# Patient Record
Sex: Male | Born: 1990 | Race: White | Hispanic: No | Marital: Single | Smoking: Never smoker
Health system: Southern US, Community
[De-identification: ages and names within clinical notes are randomized; demographics above are authoritative.]

---
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
## 2008-02-14 ENCOUNTER — Ambulatory Visit: Payer: Self-pay | Admitting: *Deleted

## 2008-05-08 ENCOUNTER — Ambulatory Visit: Payer: Self-pay | Admitting: *Deleted

## 2008-08-24 ENCOUNTER — Ambulatory Visit: Payer: Self-pay | Admitting: Pediatrics

## 2008-12-11 ENCOUNTER — Ambulatory Visit: Payer: Self-pay | Admitting: Pediatrics

## 2009-03-14 ENCOUNTER — Ambulatory Visit: Payer: Self-pay | Admitting: Pediatrics

## 2009-07-09 ENCOUNTER — Ambulatory Visit: Payer: Self-pay | Admitting: Pediatrics

## 2011-02-12 LAB — D-DIMER, QUANTITATIVE: D-Dimer, Quant: 0.36

## 2016-05-23 ENCOUNTER — Emergency Department (HOSPITAL_COMMUNITY): Payer: Worker's Compensation

## 2016-05-23 ENCOUNTER — Encounter (HOSPITAL_COMMUNITY): Payer: Self-pay | Admitting: Nurse Practitioner

## 2016-05-23 ENCOUNTER — Inpatient Hospital Stay (HOSPITAL_COMMUNITY)
Admission: EM | Admit: 2016-05-23 | Discharge: 2016-05-26 | DRG: 493 | Disposition: A | Discharge status: Home / Self Care | Payer: Worker's Compensation | Attending: Orthopaedic Surgery | Admitting: Orthopaedic Surgery

## 2016-05-23 DIAGNOSIS — S82241A Displaced spiral fracture of shaft of right tibia, initial encounter for closed fracture: Principal | ICD-10-CM | POA: Diagnosis present

## 2016-05-23 DIAGNOSIS — S82441A Displaced spiral fracture of shaft of right fibula, initial encounter for closed fracture: Secondary | ICD-10-CM | POA: Diagnosis present

## 2016-05-23 DIAGNOSIS — S82831A Other fracture of upper and lower end of right fibula, initial encounter for closed fracture: Secondary | ICD-10-CM

## 2016-05-23 DIAGNOSIS — M79604 Pain in right leg: Secondary | ICD-10-CM | POA: Diagnosis not present

## 2016-05-23 DIAGNOSIS — D62 Acute posthemorrhagic anemia: Secondary | ICD-10-CM | POA: Diagnosis not present

## 2016-05-23 DIAGNOSIS — W000XXA Fall on same level due to ice and snow, initial encounter: Secondary | ICD-10-CM | POA: Diagnosis present

## 2016-05-23 DIAGNOSIS — Z9889 Other specified postprocedural states: Secondary | ICD-10-CM

## 2016-05-23 DIAGNOSIS — T148XXA Other injury of unspecified body region, initial encounter: Secondary | ICD-10-CM

## 2016-05-23 DIAGNOSIS — W19XXXA Unspecified fall, initial encounter: Secondary | ICD-10-CM

## 2016-05-23 DIAGNOSIS — S82391A Other fracture of lower end of right tibia, initial encounter for closed fracture: Secondary | ICD-10-CM

## 2016-05-23 DIAGNOSIS — S82241D Displaced spiral fracture of shaft of right tibia, subsequent encounter for closed fracture with routine healing: Secondary | ICD-10-CM | POA: Diagnosis present

## 2016-05-23 MED ORDER — FENTANYL CITRATE (PF) 100 MCG/2ML IJ SOLN
100.0000 ug | Freq: Once | INTRAMUSCULAR | Status: AC
  Administered 2016-05-23: 100 ug via INTRAVENOUS
  Filled 2016-05-23: qty 2

## 2016-05-23 NOTE — ED Triage Notes (Signed)
Pt was brought in by EMS. Per EMS patient slipped and fell on black ice at work. When he stood up he could not apply weight or pressure and noticed it was deformed. Also stated it hurt really bad. Per EMS deformity noted and was splinted with sheet and pillow for comfort and pressure relief. Positive strong pulses, patient denied hitting head or LOC per EMS. He was given 100mcg via #18 LAC. VSS 16/62, 100, 14, 98%RA.

## 2016-05-23 NOTE — ED Provider Notes (Signed)
[facility]-EMERGENCY DEPT Provider Note   CSN: [id number] Arrival date & time: 05/23/16  2227     History   Chief Complaint Chief Complaint  Patient presents with  . Leg Injury  . Fall    HPI Jeremy Spencer is a 26 y.o. male.  HPI 26 year old male who presents after mechanical fall. He is otherwise healthy. Taking a break from work tonight when he woke slipped on black ice while walking to his car. He did not have head injury or loss of consciousness. States that he had pain to the right lower leg, and was unable to get up and ambulate. Denies any nausea or vomiting, confusion, chest pain, back pain, abdominal pain or difficulty breathing. Denies any weakness or numbness to the right lower extremity. History reviewed. No pertinent past medical history.  Patient Active Problem List   Diagnosis Date Noted  . Displaced spiral fracture of shaft of right tibia, initial encounter for closed fracture 05/24/2016    History reviewed. No pertinent surgical history.     Home Medications    Prior to Admission medications   Medication Sig Start Date End Date Taking? Authorizing Provider  oxyCODONE-acetaminophen (PERCOCET/ROXICET) 5-325 MG tablet Take 1-2 tablets by mouth every 6 (six) hours as needed for moderate pain or severe pain. 05/24/16   Lavera Guiseana Duo Kyrian Stage, MD    Family History History reviewed. No pertinent family history.  Social History Social History  Substance Use Topics  . Smoking status: Never Smoker  . Smokeless tobacco: Never Used  . Alcohol use No     Allergies   Amoxicillin   Review of Systems Review of Systems 10/14 systems reviewed and are negative other than those stated in the HPI   Physical Exam Updated Vital Signs BP (!) 155/84 (BP Location: Right Arm)   Pulse (!) 102   Temp 98.6 F (37 C) (Oral)   Resp 18   Ht 6\' 3"  (1.905 m)   Wt (!) 320 lb (145.1 kg)   SpO2 100%   BMI 40.00 kg/m   Physical Exam Physical Exam  Nursing note and vitals  reviewed. Constitutional: Well developed, well nourished, non-toxic, and in no acute distress Head: Normocephalic and atraumatic.  Mouth/Throat: Oropharynx is clear and moist.  Neck: Normal range of motion. Neck supple.  no cervical spine tenderness Cardiovascular: Normal rate and regular rhythm.   +2 DP pulses bilaterally Pulmonary/Chest: Effort normal and breath sounds normal.  no chest wall tenderness Abdominal: Soft. There is no tenderness. There is no rebound and no guarding.  Musculoskeletal: There is closed deformity to the distal right lower leg.  Neurological: Alert, no facial droop, fluent speech, sensation to light touch in bilateral lower extremities, full strength in large toe dorsi and plantar flexion in the right lower extremity. Skin: Skin is warm and dry.  Psychiatric: Cooperative   ED Treatments / Results  Labs (all labs ordered are listed, but only abnormal results are displayed) Labs Reviewed  SURGICAL PCR SCREEN    EKG  EKG Interpretation None       Radiology Dg Tibia/fibula Right  Result Date: 05/23/2016 CLINICAL DATA:  Fall on ice. Right leg pain and deformity. Initial encounter. EXAM: RIGHT TIBIA AND FIBULA - 2 VIEW COMPARISON:  None. FINDINGS: Comminuted fractures of the distal tibial and fibular diaphyses are seen, with mild lateral displacement and posterior angulation of the distal fracture fragments. IMPRESSION: Comminuted fractures of distal tibial and fibular diaphyses, as described above. Electronically Signed   By: Jonny RuizJohn  Eppie Gibson M.D.   On: 05/23/2016 23:33   Dg Ankle Complete Right  Result Date: 05/23/2016 CLINICAL DATA:  Larey Seat on ice at work today. Right ankle pain and deformity. Initial encounter. EXAM: RIGHT ANKLE - COMPLETE 3+ VIEW COMPARISON:  None. FINDINGS: Comminuted fractures of the distal tibial and fibular diaphyses. There is lateral displacement and mild posterior angulation of the distal fracture fragments. No other fractures are seen  involving the ankle joint. No evidence of ankle dislocation. IMPRESSION: Comminuted fractures of distal tibial and fibular diaphyses. Electronically Signed   By: Myles Rosenthal M.D.   On: 05/23/2016 23:34    Procedures Procedures (including critical care time)  Medications Ordered in ED Medications  acetaminophen (TYLENOL) tablet 650 mg (650 mg Oral Given 05/24/16 1130)    Or  acetaminophen (TYLENOL) suppository 650 mg ( Rectal See Alternative 05/24/16 1130)  oxyCODONE (Oxy IR/ROXICODONE) immediate release tablet 5-10 mg (not administered)  morphine 2 MG/ML injection 1 mg (1 mg Intravenous Given 05/24/16 0703)  methocarbamol (ROBAXIN) tablet 500 mg (500 mg Oral Given 05/24/16 0704)    Or  methocarbamol (ROBAXIN) 500 mg in dextrose 5 % 50 mL IVPB ( Intravenous See Alternative 05/24/16 0704)  fentaNYL (SUBLIMAZE) injection 100 mcg (100 mcg Intravenous Given 05/23/16 2351)  HYDROmorphone (DILAUDID) injection 1 mg (1 mg Intravenous Given 05/24/16 0101)  sodium chloride 0.9 % bolus 1,000 mL (0 mLs Intravenous Stopped 05/24/16 0215)  HYDROmorphone (DILAUDID) injection 1 mg (1 mg Intravenous Given 05/24/16 0235)     Initial Impression / Assessment and Plan / ED Course  I have reviewed the triage vital signs and the nursing notes.  Pertinent labs & imaging results that were available during my care of the patient were reviewed by me and considered in my medical decision making (see chart for details).     Presenting after mechanical fall with right lower leg pain and deformity. Extremity is neurovascularly in tact. No other injuries noted on exam or by history. XR of tib/fib/ankle visualized. There is comminuted, displaced closed fractures involving distal tibia and fibula. Will consult orthopedic surgery regarding management.  Final Clinical Impressions(s) / ED Diagnoses   Final diagnoses:  Fracture  Other closed fracture of distal end of right tibia, initial encounter  Other closed fracture of  distal end of right fibula, initial encounter    New Prescriptions Current Discharge Medication List    START taking these medications   Details  oxyCODONE-acetaminophen (PERCOCET/ROXICET) 5-325 MG tablet Take 1-2 tablets by mouth every 6 (six) hours as needed for moderate pain or severe pain. Qty: 20 tablet, Refills: 0         Lavera Guise, MD 05/24/16 1135

## 2016-05-23 NOTE — ED Notes (Signed)
Bed: [id number] Expected date:  Expected time:  Means of arrival:  Comments: 26 yo M  Fall, leg injury with deformity

## 2016-05-24 ENCOUNTER — Inpatient Hospital Stay (HOSPITAL_COMMUNITY): Payer: Worker's Compensation | Admitting: Anesthesiology

## 2016-05-24 ENCOUNTER — Encounter (HOSPITAL_COMMUNITY)
Admission: EM | Disposition: A | Discharge status: Home / Self Care | Payer: Self-pay | Source: Home / Self Care | Attending: Orthopaedic Surgery

## 2016-05-24 ENCOUNTER — Inpatient Hospital Stay (HOSPITAL_COMMUNITY): Payer: Worker's Compensation

## 2016-05-24 ENCOUNTER — Encounter (HOSPITAL_COMMUNITY): Payer: Self-pay | Admitting: Critical Care Medicine

## 2016-05-24 DIAGNOSIS — S82241A Displaced spiral fracture of shaft of right tibia, initial encounter for closed fracture: Principal | ICD-10-CM

## 2016-05-24 DIAGNOSIS — S82441A Displaced spiral fracture of shaft of right fibula, initial encounter for closed fracture: Secondary | ICD-10-CM | POA: Diagnosis present

## 2016-05-24 DIAGNOSIS — M79604 Pain in right leg: Secondary | ICD-10-CM | POA: Diagnosis present

## 2016-05-24 DIAGNOSIS — S82241D Displaced spiral fracture of shaft of right tibia, subsequent encounter for closed fracture with routine healing: Secondary | ICD-10-CM | POA: Diagnosis present

## 2016-05-24 DIAGNOSIS — D62 Acute posthemorrhagic anemia: Secondary | ICD-10-CM | POA: Diagnosis not present

## 2016-05-24 DIAGNOSIS — W000XXA Fall on same level due to ice and snow, initial encounter: Secondary | ICD-10-CM | POA: Diagnosis present

## 2016-05-24 HISTORY — PX: TIBIA IM NAIL INSERTION: SHX2516

## 2016-05-24 LAB — SURGICAL PCR SCREEN
MRSA, PCR: NEGATIVE
STAPHYLOCOCCUS AUREUS: NEGATIVE

## 2016-05-24 SURGERY — INSERTION, INTRAMEDULLARY ROD, TIBIA
Anesthesia: General | Site: Leg Upper | Laterality: Right

## 2016-05-24 MED ORDER — SUCCINYLCHOLINE CHLORIDE 200 MG/10ML IV SOSY
PREFILLED_SYRINGE | INTRAVENOUS | Status: AC
  Filled 2016-05-24: qty 20

## 2016-05-24 MED ORDER — METHOCARBAMOL 500 MG PO TABS: 500.0000 mg | ORAL_TABLET | Freq: Four times a day (QID) | ORAL | Status: DC | PRN

## 2016-05-24 MED ORDER — CEFAZOLIN SODIUM 1 G IJ SOLR
INTRAMUSCULAR | Status: AC
  Filled 2016-05-24: qty 30

## 2016-05-24 MED ORDER — CEFAZOLIN SODIUM 1 G IJ SOLR
INTRAMUSCULAR | Status: DC | PRN
  Administered 2016-05-24: 3 g via INTRAMUSCULAR

## 2016-05-24 MED ORDER — SODIUM CHLORIDE 0.9 % IV BOLUS (SEPSIS)
1000.0000 mL | Freq: Once | INTRAVENOUS | Status: AC
  Administered 2016-05-24: 1000 mL via INTRAVENOUS

## 2016-05-24 MED ORDER — OXYCODONE HCL 5 MG PO TABS: 5.0000 mg | ORAL_TABLET | ORAL | 0 refills | Status: AC | PRN

## 2016-05-24 MED ORDER — METHOCARBAMOL 1000 MG/10ML IJ SOLN
500.0000 mg | Freq: Four times a day (QID) | INTRAMUSCULAR | Status: DC | PRN
  Filled 2016-05-24: qty 5

## 2016-05-24 MED ORDER — HYDROMORPHONE HCL 1 MG/ML IJ SOLN
0.2500 mg | INTRAMUSCULAR | Status: DC | PRN
  Administered 2016-05-24 (×2): 0.5 mg via INTRAVENOUS

## 2016-05-24 MED ORDER — ONDANSETRON HCL 4 MG PO TABS: 4.0000 mg | ORAL_TABLET | Freq: Four times a day (QID) | ORAL | Status: DC | PRN

## 2016-05-24 MED ORDER — MEPERIDINE HCL 25 MG/ML IJ SOLN: 6.2500 mg | INTRAMUSCULAR | Status: DC | PRN

## 2016-05-24 MED ORDER — METHOCARBAMOL 1000 MG/10ML IJ SOLN
500.0000 mg | Freq: Four times a day (QID) | INTRAVENOUS | Status: DC | PRN
  Filled 2016-05-24: qty 5

## 2016-05-24 MED ORDER — OXYCODONE HCL ER 10 MG PO T12A
10.0000 mg | EXTENDED_RELEASE_TABLET | Freq: Two times a day (BID) | ORAL | Status: DC
  Administered 2016-05-24 – 2016-05-25 (×3): 10 mg via ORAL
  Filled 2016-05-24 (×3): qty 1

## 2016-05-24 MED ORDER — HYDROMORPHONE HCL 1 MG/ML IJ SOLN
1.0000 mg | Freq: Once | INTRAMUSCULAR | Status: AC
  Administered 2016-05-24: 1 mg via INTRAVENOUS
  Filled 2016-05-24: qty 1

## 2016-05-24 MED ORDER — ONDANSETRON HCL 4 MG/2ML IJ SOLN
INTRAMUSCULAR | Status: DC | PRN
  Administered 2016-05-24: 4 mg via INTRAVENOUS

## 2016-05-24 MED ORDER — PROPOFOL 10 MG/ML IV BOLUS
INTRAVENOUS | Status: AC
  Filled 2016-05-24: qty 20

## 2016-05-24 MED ORDER — ATROPINE SULFATE 1 MG/ML IJ SOLN
INTRAMUSCULAR | Status: AC
  Filled 2016-05-24: qty 1

## 2016-05-24 MED ORDER — FENTANYL CITRATE (PF) 100 MCG/2ML IJ SOLN
INTRAMUSCULAR | Status: AC
  Filled 2016-05-24: qty 2

## 2016-05-24 MED ORDER — ACETAMINOPHEN 325 MG PO TABS
650.0000 mg | ORAL_TABLET | Freq: Four times a day (QID) | ORAL | Status: DC | PRN
  Administered 2016-05-24: 650 mg via ORAL
  Filled 2016-05-24: qty 2

## 2016-05-24 MED ORDER — DIPHENHYDRAMINE HCL 12.5 MG/5ML PO ELIX: 25.0000 mg | ORAL_SOLUTION | ORAL | Status: DC | PRN

## 2016-05-24 MED ORDER — METOCLOPRAMIDE HCL 5 MG PO TABS: 5.0000 mg | ORAL_TABLET | Freq: Three times a day (TID) | ORAL | Status: DC | PRN

## 2016-05-24 MED ORDER — METOCLOPRAMIDE HCL 5 MG/ML IJ SOLN: 5.0000 mg | Freq: Three times a day (TID) | INTRAMUSCULAR | Status: DC | PRN

## 2016-05-24 MED ORDER — SUGAMMADEX SODIUM 200 MG/2ML IV SOLN
INTRAVENOUS | Status: DC | PRN
Start: 2016-05-24 — End: 2016-05-24
  Administered 2016-05-24: 200 mg via INTRAVENOUS

## 2016-05-24 MED ORDER — ONDANSETRON HCL 4 MG/2ML IJ SOLN: 4.0000 mg | Freq: Four times a day (QID) | INTRAMUSCULAR | Status: DC | PRN

## 2016-05-24 MED ORDER — DEXAMETHASONE SODIUM PHOSPHATE 10 MG/ML IJ SOLN
INTRAMUSCULAR | Status: DC | PRN
  Administered 2016-05-24: 10 mg via INTRAVENOUS

## 2016-05-24 MED ORDER — LIDOCAINE HCL (CARDIAC) 20 MG/ML IV SOLN
INTRAVENOUS | Status: DC | PRN
  Administered 2016-05-24: 100 mg via INTRAVENOUS

## 2016-05-24 MED ORDER — ASPIRIN EC 325 MG PO TBEC: 325.0000 mg | DELAYED_RELEASE_TABLET | Freq: Two times a day (BID) | ORAL | 0 refills | Status: AC

## 2016-05-24 MED ORDER — EPHEDRINE 5 MG/ML INJ
INTRAVENOUS | Status: AC
  Filled 2016-05-24: qty 10

## 2016-05-24 MED ORDER — 0.9 % SODIUM CHLORIDE (POUR BTL) OPTIME
TOPICAL | Status: DC | PRN
  Administered 2016-05-24: 1000 mL

## 2016-05-24 MED ORDER — PHENYLEPHRINE 40 MCG/ML (10ML) SYRINGE FOR IV PUSH (FOR BLOOD PRESSURE SUPPORT)
PREFILLED_SYRINGE | INTRAVENOUS | Status: AC
  Filled 2016-05-24: qty 10

## 2016-05-24 MED ORDER — MIDAZOLAM HCL 2 MG/2ML IJ SOLN
INTRAMUSCULAR | Status: AC
  Filled 2016-05-24: qty 2

## 2016-05-24 MED ORDER — CEFAZOLIN SODIUM-DEXTROSE 2-4 GM/100ML-% IV SOLN
2.0000 g | Freq: Four times a day (QID) | INTRAVENOUS | Status: AC
  Administered 2016-05-24 – 2016-05-25 (×3): 2 g via INTRAVENOUS
  Filled 2016-05-24 (×3): qty 100

## 2016-05-24 MED ORDER — SODIUM CHLORIDE 0.9 % IV SOLN
INTRAVENOUS | Status: DC
  Administered 2016-05-24: 20:00:00 via INTRAVENOUS

## 2016-05-24 MED ORDER — ROCURONIUM BROMIDE 50 MG/5ML IV SOSY
PREFILLED_SYRINGE | INTRAVENOUS | Status: DC | PRN
  Administered 2016-05-24: 40 mg via INTRAVENOUS
  Administered 2016-05-24: 10 mg via INTRAVENOUS

## 2016-05-24 MED ORDER — FENTANYL CITRATE (PF) 100 MCG/2ML IJ SOLN
INTRAMUSCULAR | Status: DC | PRN
  Administered 2016-05-24: 50 ug via INTRAVENOUS
  Administered 2016-05-24: 100 ug via INTRAVENOUS
  Administered 2016-05-24: 50 ug via INTRAVENOUS

## 2016-05-24 MED ORDER — ACETAMINOPHEN 325 MG PO TABS
650.0000 mg | ORAL_TABLET | Freq: Four times a day (QID) | ORAL | Status: DC | PRN
  Administered 2016-05-24 – 2016-05-26 (×3): 650 mg via ORAL
  Filled 2016-05-24 (×3): qty 2

## 2016-05-24 MED ORDER — PROPOFOL 10 MG/ML IV BOLUS
INTRAVENOUS | Status: AC
  Filled 2016-05-24: qty 40

## 2016-05-24 MED ORDER — LACTATED RINGERS IV SOLN
INTRAVENOUS | Status: DC
  Administered 2016-05-24 (×2): via INTRAVENOUS

## 2016-05-24 MED ORDER — KETOROLAC TROMETHAMINE 30 MG/ML IJ SOLN
30.0000 mg | Freq: Four times a day (QID) | INTRAMUSCULAR | Status: DC | PRN
  Administered 2016-05-25 – 2016-05-26 (×2): 30 mg via INTRAVENOUS
  Filled 2016-05-24 (×2): qty 1

## 2016-05-24 MED ORDER — SENNOSIDES-DOCUSATE SODIUM 8.6-50 MG PO TABS: 1.0000 | ORAL_TABLET | Freq: Every evening | ORAL | 1 refills | Status: AC | PRN

## 2016-05-24 MED ORDER — ROCURONIUM BROMIDE 50 MG/5ML IV SOSY
PREFILLED_SYRINGE | INTRAVENOUS | Status: AC
  Filled 2016-05-24: qty 5

## 2016-05-24 MED ORDER — OXYCODONE-ACETAMINOPHEN 5-325 MG PO TABS: 1.0000 | ORAL_TABLET | Freq: Four times a day (QID) | ORAL | 0 refills | Status: AC | PRN

## 2016-05-24 MED ORDER — POLYETHYLENE GLYCOL 3350 17 G PO PACK: 17.0000 g | PACK | Freq: Every day | ORAL | Status: DC | PRN

## 2016-05-24 MED ORDER — MORPHINE SULFATE (PF) 2 MG/ML IV SOLN
2.0000 mg | INTRAVENOUS | Status: DC | PRN
  Administered 2016-05-26: 2 mg via INTRAVENOUS
  Filled 2016-05-24: qty 1

## 2016-05-24 MED ORDER — PROPOFOL 10 MG/ML IV BOLUS
INTRAVENOUS | Status: DC | PRN
  Administered 2016-05-24: 200 mg via INTRAVENOUS

## 2016-05-24 MED ORDER — OXYCODONE HCL 5 MG PO TABS
5.0000 mg | ORAL_TABLET | ORAL | Status: DC | PRN
  Filled 2016-05-24: qty 2

## 2016-05-24 MED ORDER — ONDANSETRON HCL 4 MG/2ML IJ SOLN: 4.0000 mg | Freq: Once | INTRAMUSCULAR | Status: DC | PRN

## 2016-05-24 MED ORDER — PROMETHAZINE HCL 25 MG PO TABS: 25.0000 mg | ORAL_TABLET | Freq: Four times a day (QID) | ORAL | 1 refills | Status: AC | PRN

## 2016-05-24 MED ORDER — ASPIRIN EC 325 MG PO TBEC
325.0000 mg | DELAYED_RELEASE_TABLET | Freq: Two times a day (BID) | ORAL | Status: DC
  Administered 2016-05-24 – 2016-05-26 (×4): 325 mg via ORAL
  Filled 2016-05-24 (×4): qty 1

## 2016-05-24 MED ORDER — HYDROMORPHONE HCL 1 MG/ML IJ SOLN
INTRAMUSCULAR | Status: AC
  Filled 2016-05-24: qty 1

## 2016-05-24 MED ORDER — ONDANSETRON HCL 4 MG/2ML IJ SOLN
INTRAMUSCULAR | Status: AC
  Filled 2016-05-24: qty 2

## 2016-05-24 MED ORDER — ONDANSETRON HCL 4 MG PO TABS: 4.0000 mg | ORAL_TABLET | Freq: Three times a day (TID) | ORAL | 0 refills | Status: AC | PRN

## 2016-05-24 MED ORDER — METHOCARBAMOL 500 MG PO TABS
500.0000 mg | ORAL_TABLET | Freq: Four times a day (QID) | ORAL | Status: DC | PRN
Start: 2016-05-24 — End: 2016-05-26
  Administered 2016-05-24 – 2016-05-26 (×3): 500 mg via ORAL
  Filled 2016-05-24 (×3): qty 1

## 2016-05-24 MED ORDER — ACETAMINOPHEN 650 MG RE SUPP: 650.0000 mg | Freq: Four times a day (QID) | RECTAL | Status: DC | PRN

## 2016-05-24 MED ORDER — LIDOCAINE 2% (20 MG/ML) 5 ML SYRINGE
INTRAMUSCULAR | Status: AC
  Filled 2016-05-24: qty 5

## 2016-05-24 MED ORDER — ARTIFICIAL TEARS OP OINT
TOPICAL_OINTMENT | OPHTHALMIC | Status: AC
  Filled 2016-05-24: qty 3.5

## 2016-05-24 MED ORDER — OXYCODONE HCL 5 MG PO TABS
5.0000 mg | ORAL_TABLET | ORAL | Status: DC | PRN
  Administered 2016-05-25: 15 mg via ORAL
  Administered 2016-05-25 – 2016-05-26 (×2): 10 mg via ORAL
  Filled 2016-05-24 (×3): qty 2
  Filled 2016-05-24: qty 3

## 2016-05-24 MED ORDER — MAGNESIUM CITRATE PO SOLN: 1.0000 | Freq: Once | ORAL | Status: DC | PRN

## 2016-05-24 MED ORDER — MORPHINE SULFATE (PF) 2 MG/ML IV SOLN
1.0000 mg | INTRAVENOUS | Status: DC | PRN
  Administered 2016-05-24: 1 mg via INTRAVENOUS
  Filled 2016-05-24: qty 1

## 2016-05-24 MED ORDER — OXYCODONE HCL ER 10 MG PO T12A: 10.0000 mg | EXTENDED_RELEASE_TABLET | Freq: Two times a day (BID) | ORAL | 0 refills | Status: AC

## 2016-05-24 MED ORDER — SORBITOL 70 % SOLN: 30.0000 mL | Freq: Every day | Status: DC | PRN

## 2016-05-24 MED ORDER — MIDAZOLAM HCL 5 MG/5ML IJ SOLN
INTRAMUSCULAR | Status: DC | PRN
  Administered 2016-05-24: 2 mg via INTRAVENOUS

## 2016-05-24 MED ORDER — METHOCARBAMOL 750 MG PO TABS: 750.0000 mg | ORAL_TABLET | Freq: Two times a day (BID) | ORAL | 0 refills | Status: DC | PRN

## 2016-05-24 MED ORDER — SUCCINYLCHOLINE CHLORIDE 200 MG/10ML IV SOSY
PREFILLED_SYRINGE | INTRAVENOUS | Status: DC | PRN
  Administered 2016-05-24: 160 mg via INTRAVENOUS

## 2016-05-24 SURGICAL SUPPLY — 62 items
BANDAGE ACE 4X5 VEL STRL LF (GAUZE/BANDAGES/DRESSINGS) ×2 IMPLANT
BANDAGE ACE 6X5 VEL STRL LF (GAUZE/BANDAGES/DRESSINGS) ×2 IMPLANT
BANDAGE ELASTIC 4 VELCRO ST LF (GAUZE/BANDAGES/DRESSINGS) ×2 IMPLANT
BANDAGE ELASTIC 6 VELCRO ST LF (GAUZE/BANDAGES/DRESSINGS) ×2 IMPLANT
BANDAGE ESMARK 6X9 LF (GAUZE/BANDAGES/DRESSINGS) ×1 IMPLANT
BIT DRILL AO GAMMA 4.2X130 (BIT) ×2 IMPLANT
BIT DRILL AO GAMMA 4.2X180 (BIT) ×2 IMPLANT
BIT DRILL AO GAMMA 4.2X340 (BIT) ×4 IMPLANT
BNDG ESMARK 6X9 LF (GAUZE/BANDAGES/DRESSINGS) ×2
COVER MAYO STAND STRL (DRAPES) ×2 IMPLANT
COVER SURGICAL LIGHT HANDLE (MISCELLANEOUS) ×2 IMPLANT
CUFF TOURNIQUET SINGLE 34IN LL (TOURNIQUET CUFF) ×2 IMPLANT
DRAPE C-ARM 42X72 X-RAY (DRAPES) ×2 IMPLANT
DRAPE C-ARMOR (DRAPES) ×2 IMPLANT
DRAPE HALF SHEET 40X57 (DRAPES) ×2 IMPLANT
DRAPE IMP U-DRAPE 54X76 (DRAPES) ×2 IMPLANT
DRAPE POUCH INSTRU U-SHP 10X18 (DRAPES) ×2 IMPLANT
DRAPE U-SHAPE 47X51 STRL (DRAPES) ×2 IMPLANT
DRAPE UTILITY XL STRL (DRAPES) ×4 IMPLANT
DURAPREP 26ML APPLICATOR (WOUND CARE) ×2 IMPLANT
ELECT CAUTERY BLADE 6.4 (BLADE) ×2 IMPLANT
ELECT REM PT RETURN 9FT ADLT (ELECTROSURGICAL) ×2
ELECTRODE REM PT RTRN 9FT ADLT (ELECTROSURGICAL) ×1 IMPLANT
FACESHIELD WRAPAROUND (MASK) IMPLANT
GAUZE SPONGE 4X4 12PLY STRL (GAUZE/BANDAGES/DRESSINGS) ×2 IMPLANT
GAUZE XEROFORM 1X8 LF (GAUZE/BANDAGES/DRESSINGS) ×2 IMPLANT
GLOVE SKINSENSE NS SZ7.5 (GLOVE) ×4
GLOVE SKINSENSE STRL SZ7.5 (GLOVE) ×4 IMPLANT
GOWN STRL REIN XL XLG (GOWN DISPOSABLE) ×2 IMPLANT
GUIDEROD T2 3X1000 (ROD) ×2 IMPLANT
GUIDEWIRE GAMMA (WIRE) ×4 IMPLANT
GUIDEWIRE GAMMA SM TIP 3X800MM (WIRE) ×2 IMPLANT
K-WIRE FIXATION 3X285 COATED (WIRE) ×4
KIT BASIN OR (CUSTOM PROCEDURE TRAY) ×2 IMPLANT
KWIRE FIXATION 3X285 COATED (WIRE) ×2 IMPLANT
MANIFOLD NEPTUNE II (INSTRUMENTS) ×2 IMPLANT
NAIL ELAS INSERT SLV SPI 8-11 ×2 IMPLANT
NAIL TIBIAL STD 10X390MM (Nail) ×2 IMPLANT
NS IRRIG 1000ML POUR BTL (IV SOLUTION) ×2 IMPLANT
PACK TOTAL JOINT (CUSTOM PROCEDURE TRAY) ×2 IMPLANT
PACK UNIVERSAL I (CUSTOM PROCEDURE TRAY) ×2 IMPLANT
PAD CAST 4YDX4 CTTN HI CHSV (CAST SUPPLIES) ×2 IMPLANT
PADDING CAST ABS 4INX4YD NS (CAST SUPPLIES) ×1
PADDING CAST ABS 6INX4YD NS (CAST SUPPLIES) ×1
PADDING CAST ABS COTTON 4X4 ST (CAST SUPPLIES) ×1 IMPLANT
PADDING CAST ABS COTTON 6X4 NS (CAST SUPPLIES) ×1 IMPLANT
PADDING CAST COTTON 4X4 STRL (CAST SUPPLIES) ×2
SCREW LOCKING T2 F/T  5MMX55MM (Screw) ×1 IMPLANT
SCREW LOCKING T2 F/T  5MMX70MM (Screw) ×1 IMPLANT
SCREW LOCKING T2 F/T  5X42.5MM (Screw) ×1 IMPLANT
SCREW LOCKING T2 F/T 5MMX55MM (Screw) ×1 IMPLANT
SCREW LOCKING T2 F/T 5MMX70MM (Screw) ×1 IMPLANT
SCREW LOCKING T2 F/T 5X42.5MM (Screw) ×1 IMPLANT
SHAFT REAMER  8X885MM (MISCELLANEOUS) ×1
SHAFT REAMER 8X885MM (MISCELLANEOUS) ×1 IMPLANT
STAPLER SKIN PROX WIDE 3.9 (STAPLE) ×2 IMPLANT
SUT VIC AB 0 CT1 27 (SUTURE) ×2
SUT VIC AB 0 CT1 27XBRD ANTBC (SUTURE) ×2 IMPLANT
SUT VIC AB 2-0 CT1 27 (SUTURE) ×2
SUT VIC AB 2-0 CT1 TAPERPNT 27 (SUTURE) ×2 IMPLANT
TOWEL OR 17X26 10 PK STRL BLUE (TOWEL DISPOSABLE) ×4 IMPLANT
WATER STERILE IRR 1000ML POUR (IV SOLUTION) ×2 IMPLANT

## 2016-05-24 NOTE — ED Notes (Signed)
Ortho tech at bedside for splinting

## 2016-05-24 NOTE — Anesthesia Preprocedure Evaluation (Signed)
Anesthesia Evaluation  Patient identified by MRN, date of birth, ID band Patient awake    Reviewed: Allergy & Precautions, NPO status , Patient's Chart, lab work & pertinent test results  Airway Mallampati: II  TM Distance: >3 FB Neck ROM: Full    Dental   Pulmonary    Pulmonary exam normal        Cardiovascular Normal cardiovascular exam     Neuro/Psych    GI/Hepatic   Endo/Other    Renal/GU      Musculoskeletal   Abdominal   Peds  Hematology   Anesthesia Other Findings   Reproductive/Obstetrics                             Anesthesia Physical Anesthesia Plan  ASA: II  Anesthesia Plan: General   Post-op Pain Management:    Induction: Intravenous, Rapid sequence and Cricoid pressure planned  Airway Management Planned: Oral ETT  Additional Equipment:   Intra-op Plan:   Post-operative Plan: Extubation in OR  Informed Consent: I have reviewed the patients History and Physical, chart, labs and discussed the procedure including the risks, benefits and alternatives for the proposed anesthesia with the patient or authorized representative who has indicated his/her understanding and acceptance.     Plan Discussed with: CRNA and Surgeon  Anesthesia Plan Comments:         Anesthesia Quick Evaluation

## 2016-05-24 NOTE — Anesthesia Procedure Notes (Signed)
Procedure Name: Intubation Date/Time: 05/24/2016 2:26 PM Performed by: Merrilyn Puma B Pre-anesthesia Checklist: Patient identified, Emergency Drugs available, Suction available, Patient being monitored and Timeout performed Patient Re-evaluated:Patient Re-evaluated prior to inductionOxygen Delivery Method: Circle system utilized Preoxygenation: Pre-oxygenation with 100% oxygen Intubation Type: IV induction Ventilation: Mask ventilation without difficulty Laryngoscope Size: Mac and 4 Grade View: Grade I Tube type: Oral Tube size: 7.5 mm Number of attempts: 1 Airway Equipment and Method: Stylet Placement Confirmation: ETT inserted through vocal cords under direct vision,  positive ETCO2,  CO2 detector and breath sounds checked- equal and bilateral Secured at: 24 cm Tube secured with: Tape Dental Injury: Teeth and Oropharynx as per pre-operative assessment

## 2016-05-24 NOTE — Transfer of Care (Signed)
Immediate Anesthesia Transfer of Care Note  Patient: Jeremy Spencer  Procedure(s) Performed: Procedure(s): INTRAMEDULLARY (IM) NAIL TIBIAL (Right)  Patient Location: PACU  Anesthesia Type:General  Level of Consciousness: sedated  Airway & Oxygen Therapy: Patient Spontanous Breathing and Patient connected to face mask oxygen  Post-op Assessment: Report given to RN and Post -op Vital signs reviewed and stable  Post vital signs: Reviewed and stable  Last Vitals:  Vitals:   05/24/16 0500 05/24/16 1323  BP: (!) 155/84 (!) 150/82  Pulse: (!) 102 (!) 101  Resp: 18 17  Temp: 37 C 36.9 C    Last Pain:  Vitals:   05/24/16 1323  TempSrc: Oral  PainSc:       Patients Stated Pain Goal: 3 (05/24/16 0704)  Complications: No apparent anesthesia complications

## 2016-05-24 NOTE — Op Note (Signed)
Date of Surgery: 05/24/2016  INDICATIONS: Mr. Smoker is a 26 y.o.-year-old male who was involved in a ground level fall and sustained a right tibia fracture. The risks and benefits of the procedure discussed with the patient prior to the procedure and all questions were answered; consent was obtained.  PREOPERATIVE DIAGNOSIS:  right tibia fracture  POSTOPERATIVE DIAGNOSIS: Same  PROCEDURE:   1. right tibia closed reduction and intramedullary nailing CPT: 27759  2. closed treatment of left fibular shaft fracture with manipulation, CPT - 45409  SURGEON: N. Glee Arvin, M.D.  ASSISTANT: Doneen Poisson, MD necessary for timely completion of surgery .  ANESTHESIA:  general  IV FLUIDS AND URINE: See anesthesia record.  ESTIMATED BLOOD LOSS: minimal mL.  IMPLANTS: Stryker 10 x 390   DRAINS: None.  COMPLICATIONS: None.  DESCRIPTION OF PROCEDURE: The patient was brought to the operating room and placed supine on the operating table.  The patient's leg had been signed prior to the procedure.  The patient had the anesthesia placed by the anesthesiologist.  The prep verification and incision time-outs were performed to confirm that this was the correct patient, site, side and location. The patient had an SCD on the opposite lower extremity. The patient did receive antibiotics prior to the incision and was re-dosed during the procedure as needed at indicated intervals.  The patient had the lower extremity prepped and draped in the standard surgical fashion.  The incision was first made over the quadriceps tendon in the midline and taken down to the skin and subcutaneous tissue to expose the peritenon. The peritenon was incised in line with the skin incision and then a poke hole was made in the quadriceps tendon in the midline. A knife was then used to longitudinally divide the tendon in line with its fibers, taking care not to cross over any fibers. The guide wire was placed at the  proximal, anterior tibia, confirming its location on both AP and lateral views. The wire was drilled into the bone and then the opening reamer was placed over this and maneuvered so that the reamer was parallel with anterior cortex of the tibia. The ball-tipped guide wire was then placed down into the canal towards the fracture site. The fracture was reduced and the wire was passed and confirmed to be in the proper location on both AP and lateral views.  The measuring stick was used to measure the length of the nail.  Sequential reaming was then performed, then the nail was gently hammered into place over the guide wire and the guide wire was removed. The proximal screws were placed through the interlocking drill guide using the sleeve. The distal screws were placed using the perfect circles technique. All screws were placed in the standard fashion, first incising the skin and then spreading with a tonsil, then drilling, measuring with a depth gauge, and then placing the screws by hand. The final x-rays were taken in both AP and lateral views to confirm the fracture reduction as well as the placement of all hardware. The fibula fracture was treated in a closed manner.  The wounds were copiously irrigated with saline and then the peritenon was closed with 0 Vicryl figure-of-eight interrupted sutures. 2.0 vicryl was used to close the subcutaneous layer.  Staples were then used to close all of the open incision wounds.  The wounds were cleaned and dried a final time and a sterile dressing was placed. The patient was then placed in a short leg splint in  neutral ankle dorsiflexion. The patient's calf was soft to palpation at the end of the case.  The patient was then transferred to a bed and taken to the recovery room in stable condition.  All counts were correct at the end of the case.  POSTOPERATIVE PLAN: Jeremy Spencer will be NWB and will return 2 weeks for suture removal.  Jeremy Spencer will receive DVT  prophylaxis.  Mayra ReelN. Michael Macgregor Aeschliman, MD [facility] [phone] 3:57 PM

## 2016-05-24 NOTE — ED Provider Notes (Signed)
Pt d/w Dr[facility] Rod[facility]Xu [facility]  Pt notified and ok with plan.   Jacalyn LefevreJulie Avryl Roehm, MD 05/24/16 [phone]

## 2016-05-24 NOTE — Anesthesia Postprocedure Evaluation (Signed)
Anesthesia Post Note  Patient: Jeremy Spencer  Procedure(s) Performed: Procedure(s) (LRB): INTRAMEDULLARY (IM) NAIL TIBIAL (Right)  Patient location during evaluation: PACU Anesthesia Type: General Level of consciousness: awake and alert Pain management: pain level controlled Vital Signs Assessment: post-procedure vital signs reviewed and stable Respiratory status: spontaneous breathing, nonlabored ventilation, respiratory function stable and patient connected to nasal cannula oxygen Cardiovascular status: blood pressure returned to baseline and stable Postop Assessment: no signs of nausea or vomiting Anesthetic complications: no       Last Vitals:  Vitals:   05/24/16 1705 05/24/16 1727  BP: (!) 157/92 (!) 163/100  Pulse: 82 94  Resp: 16 16  Temp: 36.6 C 36.9 C    Last Pain:  Vitals:   05/24/16 1727  TempSrc: Oral  PainSc:                  Jaylinn Hellenbrand DAVID

## 2016-05-24 NOTE — Discharge Instructions (Signed)
Please follow-up with orthopedic surgery, number listed above.  Return for worsening symptoms, including escalating pain, discoloration of toes, new numbness/weakness or any other symptoms concerning to you.

## 2016-05-24 NOTE — Progress Notes (Signed)
[facility]  Patient is alert and oriented x 4; denies pain at this time.  He was given Dilaudid iv during the trip.  Patient would like to get some rest before surgery today.

## 2016-05-25 ENCOUNTER — Encounter (HOSPITAL_COMMUNITY): Payer: Self-pay | Admitting: Orthopaedic Surgery

## 2016-05-25 MED FILL — Hydromorphone HCl Inj 2 MG/ML: INTRAMUSCULAR | Qty: 1 | Status: AC

## 2016-05-25 NOTE — Evaluation (Addendum)
Occupational Therapy Evaluation Patient Details Name: Jeremy Spencer MRN: [id number] DOB: May 22, 1990 Today's Date: 05/25/2016    History of Present Illness 26 year old male who presents after mechanical fall. He is otherwise healthy. Taking a break from work tonight when he woke slipped on black ice while walking to his car. R tibia fx, now s/p right tibia closed reduction and intramedullary nailing, NWB, and closed treatment of left fibular shaft fracture with manipulation   Clinical Impression   PTA, pt was independent with ADL and functional mobility. He was working as a Industrial/product designer. Pt currently requires min assist with standing grooming tasks, toilet transfers, and LB ADL. Pt impulsive and anxious with movement this session requiring VC's to maintain attention to task. Pt would benefit from continued OT services while admitted in order to improve independence with ADL and functional mobility to ensure safe D/C home with intermittent supervision. Recommend home health OT services post-acute D/C to maximize independence with ADL and return to PLOF. OT will continue to follow acutely with focus on fall prevention and showering/bathing options.    Follow Up Recommendations  Home health OT;Supervision/Assistance - 24 hour    Equipment Recommendations  3 in 1 bedside commode;Tub/shower seat (needs wide)   Recommendations for Other Services       Precautions / Restrictions Precautions Precautions: Fall Required Braces or Orthoses: Other Brace/Splint Other Brace/Splint: Cam boot (Not in room yet during initial PT eval) Restrictions Weight Bearing Restrictions: Yes RLE Weight Bearing: Non weight bearing      Mobility Bed Mobility               General bed mobility comments: OOB on OT arrival  Transfers Overall transfer level: Needs assistance Equipment used: Rolling walker (2 wheeled) Transfers: Sit to/from Stand Sit to Stand: Min assist         General transfer  comment: Min assist to stand from recliner with RW. Cues for safety and to prevent impulsivity.    Balance Overall balance assessment: Needs assistance Sitting-balance support: No upper extremity supported;Feet supported Sitting balance-Leahy Scale: Good     Standing balance support: Bilateral upper extremity supported;Single extremity supported;During functional activity Standing balance-Leahy Scale: Poor Standing balance comment: Reliant on single UE support during grooming tasks at sink.                            ADL Overall ADL's : Needs assistance/impaired Eating/Feeding: Set up;Sitting   Grooming: Min guard;Minimal assistance;Standing;Wash/dry hands Grooming Details (indicate cue type and reason): Min guard with occasional min assist to to LOB and rushing through task. Upper Body Bathing: Set up;Sitting   Lower Body Bathing: Sit to/from stand;Minimal assistance   Upper Body Dressing : Set up;Sitting   Lower Body Dressing: Minimal assistance;Sit to/from stand   Toilet Transfer: Min guard;Minimal assistance;Ambulation;BSC;RW Toilet Transfer Details (indicate cue type and reason): Min assist for safety and VC's to slow down and focus on task. Toileting- Architect and Hygiene: Min guard;Sit to/from stand;Minimal assistance       Functional mobility during ADLs: Minimal assistance;Rolling walker General ADL Comments: Educated pt on safe ADL strategies post-acute D/C as well as elevation and ice for edema control/     Vision Vision Assessment?: No apparent visual deficits   Perception     Praxis      Pertinent Vitals/Pain Pain Assessment: Faces Faces Pain Scale: Hurts even more Pain Location: R lower leg when in dependent position Pain Descriptors /  Indicators: Aching;Discomfort;Grimacing Pain Intervention(s): Limited activity within patient's tolerance;Monitored during session;Repositioned;Ice applied     Hand Dominance Right    Extremity/Trunk Assessment Upper Extremity Assessment Upper Extremity Assessment: Overall WFL for tasks assessed   Lower Extremity Assessment Lower Extremity Assessment: RLE deficits/detail RLE Deficits / Details: Decreased strength and ROM as expected post-operatively and limited by post-op pain. RLE: Unable to fully assess due to pain       Communication Communication Communication: No difficulties   Cognition Arousal/Alertness: Awake/alert Behavior During Therapy: WFL for tasks assessed/performed;Impulsive Overall Cognitive Status: Within Functional Limits for tasks assessed                 General Comments: Constantly problem-solving and impulsive. Needs cues to slow down and ensure safety as well as    General Comments       Exercises       Shoulder Instructions      Home Living Family/patient expects to be discharged to:: Private residence Living Arrangements: Parent Available Help at Discharge: Available PRN/intermittently Type of Home: House Home Access: Stairs to enter Entergy CorporationEntrance Stairs-Number of Steps: 20 Entrance Stairs-Rails: None Home Layout: Two level;Bed/bath upstairs Alternate Level Stairs-Number of Steps: Flight Alternate Level Stairs-Rails:  (one rail; not sure which side) Bathroom Shower/Tub: Tub/shower unit;Walk-in shower (has option of either) Shower/tub characteristics: Curtain FirefighterBathroom Toilet: Handicapped height     Home Equipment: None          Prior Functioning/Environment Level of Independence: Independent        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        OT Problem List: Decreased strength;Decreased range of motion;Decreased activity tolerance;Impaired balance (sitting and/or standing);Decreased safety awareness;Decreased knowledge of use of DME or AE;Decreased knowledge of precautions;Pain   OT Treatment/Interventions: Self-care/ADL training;Therapeutic exercise;Therapeutic activities;Patient/family education;Balance  training;DME and/or AE instruction;Energy conservation    OT Goals(Current goals can be found in the care plan section) Acute Rehab OT Goals Patient Stated Goal: to be able to manage  OT Goal Formulation: With patient/family Time For Goal Achievement: 06/01/16 Potential to Achieve Goals: Good ADL Goals Pt Will Perform Lower Body Bathing: with modified independence;sit to/from stand (with or without AE) Pt Will Perform Lower Body Dressing: with modified independence;sit to/from stand (with or without AE) Pt Will Transfer to Toilet: with modified independence;ambulating;bedside commode (BSC over toilet) Pt Will Perform Toileting - Clothing Manipulation and hygiene: with modified independence;sit to/from stand Pt Will Perform Tub/Shower Transfer: Shower transfer;with modified independence;shower seat;3 in 1;rolling walker;ambulating Additional ADL Goal #1: Pt will independently identify 3 fall prevention strategies to incorporate into daily routine in order to improve safety with ADL.  OT Frequency: Min 2X/week   Barriers to D/C:            Co-evaluation              End of Session Equipment Utilized During Treatment: Gait belt;Rolling walker  Activity Tolerance: Patient tolerated treatment well Patient left: in chair;with call bell/phone within reach;with family/visitor present   Time: 1432-1500 OT Time Calculation (min): 28 min Charges:  OT General Charges $OT Visit: 1 Procedure OT Evaluation $OT Eval Moderate Complexity: 1 Procedure OT Treatments $Self Care/Home Management : 8-22 mins G-Codes:    Doristine SectionCharity A Waymond Meador, OTR/L [phone] 05/25/2016, 4:16 PM

## 2016-05-25 NOTE — Progress Notes (Signed)
Orthopedic Tech Progress Note Patient Details:  Jeremy Spencer Sep 27, 1990 [id number]  Ortho Devices Type of Ortho Device: Crutches, CAM walker Ortho Device/Splint Interventions: Application   Saul FordyceJennifer C Kerman Pfost 05/25/2016, 11:06 AM

## 2016-05-25 NOTE — Evaluation (Signed)
Physical Therapy Evaluation Patient Details Name: Jeremy Spencer MRN: [id number] DOB: 1991-01-26 Today's Date: 05/25/2016   History of Present Illness  26 year old male who presents after mechanical fall. He is otherwise healthy. Taking a break from work tonight when he woke slipped on black ice while walking to his car. R tibia fx, now s/p right tibia closed reduction and intramedullary nailing, NWB, and closed treatment of left fibular shaft fracture with manipulation  Clinical Impression   Patient is s/p above surgery resulting in functional limitations due to the deficits listed below (see PT Problem List). Susy Frizzle is anxious to move, but with practice his confidence grew; We worked with both RW and crutches; still unsteady and inefficient, but I anticipate good improvements with practice; We discussed multiple ways to ascend steps into his home; Would like another PT session for further training, RN aware;  Patient will benefit from skilled PT to increase their independence and safety with mobility to allow discharge to the venue listed below.       Follow Up Recommendations Home health PT;Supervision - Intermittent    Equipment Recommendations  Rolling walker with 5" wheels;Crutches (Consider wheelchair with elevating legrest)    Recommendations for Other Services OT consult     Precautions / Restrictions Precautions Precautions: Fall Required Braces or Orthoses: Other Brace/Splint Other Brace/Splint: Cam boot (Not in room yet during initial PT eval) Restrictions RLE Weight Bearing: Non weight bearing      Mobility  Bed Mobility Overal bed mobility: Needs Assistance Bed Mobility: Supine to Sit     Supine to sit: Min assist     General bed mobility comments: Min assist to support RLE coming off of bed; cues for technique and for optimal hip position  Transfers Overall transfer level: Needs assistance Equipment used: Rolling walker (2 wheeled);Crutches Transfers: Sit  to/from Stand Sit to Stand: Min assist;Min guard         General transfer comment: Min assist with initial sit to stand from bed; cues for hand placement and safety; noting improvement with practice; progressed to minguard assist with sit<>stand from recliner to standing with crutches; cues for crutch management  Ambulation/Gait Ambulation/Gait assistance: Min assist;Min guard Ambulation Distance (Feet): 60 Feet Assistive device: Rolling walker (2 wheeled);Crutches Gait Pattern/deviations: Step-to pattern     General Gait Details: Initially walking with RW; short, effortful steps, and fatigued quickly; then practiced walking with crutches; cues for technique, especially to get body weight supported in crutches before advancing L foot for more efficient stepping -- needs mroe practice  Stairs Stairs: Yes Stairs assistance: Min assist;+2 safety/equipment Stair Management: No rails;Backwards;With walker;Step to pattern Number of Stairs: 3 General stair comments: Discussed multiple ways to manage stairs, including backwards with Rw (which we practiced), crutches, and sitting down and bumping up steps (and using step stool, ottoman and chair to help get to standing at the top of the stairs)  Wheelchair Mobility    Modified Rankin (Stroke Patients Only)       Balance                                             Pertinent Vitals/Pain Pain Assessment: 0-10 Pain Score: 5  Pain Location: R lower leg when in dependent position Pain Descriptors / Indicators: Aching;Discomfort;Grimacing Pain Intervention(s): Monitored during session (elevated extremity)    Home Living Family/patient expects to be discharged to::  Private residence Living Arrangements: Parent Available Help at Discharge: Available PRN/intermittently Type of Home: House Home Access: Stairs to enter Entrance Stairs-Rails: None Secretary/administratorntrance Stairs-Number of Steps: 20 Home Layout: Two level;Bed/bath  upstairs Home Equipment: None      Prior Function Level of Independence: Independent         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     Hand Dominance        Extremity/Trunk Assessment   Upper Extremity Assessment Upper Extremity Assessment: Defer to OT evaluation    Lower Extremity Assessment Lower Extremity Assessment: RLE deficits/detail RLE Deficits / Details: Grossly decr AROM and strength R knee and ankle, limited by pain postop; Able to passively dorsiflex R ankle to just shy of neutral -- but painful RLE: Unable to fully assess due to pain       Communication   Communication: No difficulties  Cognition Arousal/Alertness: Awake/alert Behavior During Therapy: WFL for tasks assessed/performed;Impulsive Overall Cognitive Status: Within Functional Limits for tasks assessed                 General Comments: Is constantly thinking/problem-solving; needs cues to slow down and consider the task at hand    General Comments General comments (skin integrity, edema, etc.): Discussed the need to preserve R ankle dorsiflexion -- CAM boot with help with that    Exercises     Assessment/Plan    PT Assessment Patient needs continued PT services  PT Problem List Decreased strength;Decreased range of motion;Decreased activity tolerance;Decreased balance;Decreased mobility;Decreased knowledge of use of DME;Pain          PT Treatment Interventions DME instruction;Gait training;Stair training;Functional mobility training;Therapeutic activities;Therapeutic exercise;Balance training;Patient/family education    PT Goals (Current goals can be found in the Care Plan section)  Acute Rehab PT Goals Patient Stated Goal: to be able to manage  PT Goal Formulation: With patient Time For Goal Achievement: 06/01/16 Potential to Achieve Goals: Good    Frequency Min 6X/week   Barriers to discharge Inaccessible home environment 20 steps garage to first floor, then flight to get to  bedroom    Co-evaluation               End of Session Equipment Utilized During Treatment: Gait belt Activity Tolerance: Patient tolerated treatment well Patient left: in chair;with call bell/phone within reach;with family/visitor present Nurse Communication: Mobility status         Time: [id number] PT Time Calculation (min) (ACUTE ONLY): 58 min   Charges:   PT Evaluation $PT Eval Moderate Complexity: 1 Procedure PT Treatments $Gait Training: 23-37 mins $Therapeutic Activity: 8-22 mins   PT G Codes:        Levi AlandHolly H Sonu Kruckenberg 05/25/2016, 10:15 AM  Van ClinesHolly Moussa Wiegand, PT  Acute Rehabilitation Services Pager [phone] Office [phone]

## 2016-05-25 NOTE — Progress Notes (Signed)
   Subjective:  Patient reports pain as moderate.  No events.  Objective:   VITALS:   Vitals:   05/24/16 2042 05/24/16 2222 05/25/16 0028 05/25/16 0608  BP: (!) 149/83  134/67 119/69  Pulse: 96  (!) 102 92  Resp: 16  16 16   Temp: (!) 101 F (38.3 C) 98.3 F (36.8 C) 99.7 F (37.6 C) 98.4 F (36.9 C)  TempSrc: Oral  Oral Oral  SpO2: 97%  97% 97%  Weight:      Height:        Neurologically intact Neurovascular intact Sensation intact distally Intact pulses distally Dorsiflexion/Plantar flexion intact Incision: dressing C/D/I and no drainage No cellulitis present Compartment soft   No results found for: WBC, HGB, HCT, MCV, PLT   Assessment/Plan:  1 Day Post-Op   - Expected postop acute blood loss anemia - will monitor for symptoms - Up with PT/OT - DVT ppx - SCDs, ambulation, aspirin - NWB operative extremity - Pain control - Discharge planning  Glee ArvinMichael Fizza Scales 05/25/2016, 8:00 AM [phone]

## 2016-05-25 NOTE — H&P (Signed)
ORTHOPAEDIC HISTORY AND PHYSICAL   Chief Complaint: right tibia fx  HPI: Jeremy Spencer is a 26 y.o. male who complains of right tibia fx s/p fall.  Denies LOC, neck pain, abd pain.  Pain is severe and worse with movement in right leg.  Ortho consulted to evaluate.  History reviewed. No pertinent past medical history. Past Surgical History:  Procedure Laterality Date  . TIBIA IM NAIL INSERTION Right 05/24/2016   Procedure: INTRAMEDULLARY (IM) NAIL TIBIAL;  Surgeon: Tarry KosNaiping M Argelia Formisano, MD;  Location: [facility] OR;  Service: Orthopedics;  Laterality: Right;   Social History   Social History  . Marital status: Single    Spouse name: N/A  . Number of children: N/A  . Years of education: N/A   Social History Main Topics  . Smoking status: Never Smoker  . Smokeless tobacco: Never Used  . Alcohol use No  . Drug use: No  . Sexual activity: Yes    Birth control/ protection: Condom   Other Topics Concern  . None   Social History Narrative  . None   History reviewed. No pertinent family history. Allergies  Allergen Reactions  . Amoxicillin Other (See Comments)    Pt unsure of reaction, has a child when they occurred   Prior to Admission medications   Medication Sig Start Date End Date Taking? Authorizing Provider  aspirin EC 325 MG tablet Take 1 tablet (325 mg total) by mouth 2 (two) times daily. 05/24/16   Khristina Janota Donnelly StagerM Shakiara Lukic, MD  methocarbamol (ROBAXIN) 750 MG tablet Take 1 tablet (750 mg total) by mouth 2 (two) times daily as needed for muscle spasms. 05/24/16   Weslynn Ke Donnelly StagerM Berna Gitto, MD  ondansetron (ZOFRAN) 4 MG tablet Take 1-2 tablets (4-8 mg total) by mouth every 8 (eight) hours as needed for nausea or vomiting. 05/24/16   Tarry KosNaiping M Jeylin Woodmansee, MD  oxyCODONE (OXY IR/ROXICODONE) 5 MG immediate release tablet Take 1-3 tablets (5-15 mg total) by mouth every 4 (four) hours as needed. 05/24/16   Tarry KosNaiping M Janari Gagner, MD  oxyCODONE (OXYCONTIN) 10 mg 12 hr tablet Take 1 tablet (10 mg total) by mouth every 12 (twelve)  hours. 05/24/16   Arrington Yohe Donnelly StagerM Taivon Haroon, MD  oxyCODONE-acetaminophen (PERCOCET/ROXICET) 5-325 MG tablet Take 1-2 tablets by mouth every 6 (six) hours as needed for moderate pain or severe pain. 05/24/16   Lavera Guiseana Duo Liu, MD  promethazine (PHENERGAN) 25 MG tablet Take 1 tablet (25 mg total) by mouth every 6 (six) hours as needed for nausea. 05/24/16   Annisa Mazzarella Donnelly StagerM Arrietty Dercole, MD  senna-docusate (SENOKOT S) 8.6-50 MG tablet Take 1 tablet by mouth at bedtime as needed. 05/24/16   Tarry KosNaiping M Shaneen Reeser, MD   Dg Tibia/fibula Right  Result Date: 05/24/2016 CLINICAL DATA:  Tibial fracture repair FLUOROSCOPY TIME:  3 minutes and 20 seconds. Images: 10 EXAM: RIGHT TIBIA AND FIBULA - 2 VIEW COMPARISON:  May 23, 2016 FINDINGS: Fibular fractures identified. An intramedullary rod crosses the distal tibial fracture by the end of the study with 1 superior and twos inferior interlocking screws. Hardware is in good position. IMPRESSION: Tibial fracture repair as above.  Continued fibular fracture. Electronically Signed   By: Gerome Samavid  Williams III M.D   On: 05/24/2016 17:31   Dg Tibia/fibula Right  Result Date: 05/23/2016 CLINICAL DATA:  Fall on ice. Right leg pain and deformity. Initial encounter. EXAM: RIGHT TIBIA AND FIBULA - 2 VIEW COMPARISON:  None. FINDINGS: Comminuted fractures of the distal tibial and fibular diaphyses are seen, with  mild lateral displacement and posterior angulation of the distal fracture fragments. IMPRESSION: Comminuted fractures of distal tibial and fibular diaphyses, as described above. Electronically Signed   By: Myles Rosenthal M.D.   On: 05/23/2016 23:33   Dg Ankle Complete Right  Result Date: 05/23/2016 CLINICAL DATA:  Larey Seat on ice at work today. Right ankle pain and deformity. Initial encounter. EXAM: RIGHT ANKLE - COMPLETE 3+ VIEW COMPARISON:  None. FINDINGS: Comminuted fractures of the distal tibial and fibular diaphyses. There is lateral displacement and mild posterior angulation of the distal fracture fragments. No  other fractures are seen involving the ankle joint. No evidence of ankle dislocation. IMPRESSION: Comminuted fractures of distal tibial and fibular diaphyses. Electronically Signed   By: Myles Rosenthal M.D.   On: 05/23/2016 23:34   Dg Tibia/fibula Right Port  Result Date: 05/25/2016 CLINICAL DATA:  ORIF of tibial fracture EXAM: PORTABLE RIGHT TIBIA AND FIBULA - 2 VIEW COMPARISON:  05/24/2016 c-arm fluoroscopic views and radiographs from 05/23/2016 FINDINGS: Intramedullary nail traverses a closed comminuted fracture of the distal tibial diaphysis. There is slight medial displacement 1/4 shaft of the medial tibial fracture fragment. The distal tibial fracture fragment is slightly displaced laterally by 5 mm. Oblique fracture of the distal fibular diaphysis with slight lateral displacement of the distal fracture fragment 1/4 shaft width is seen without hardware noted. IMPRESSION: ORIF of a comminuted closed distal tibial diaphyseal shaft fracture as above with IM nail. No significant change with respect to a distal fibular diaphyseal fracture. Electronically Signed   By: Tollie Eth M.D.   On: 05/25/2016 03:38   Dg C-arm 1-60 Min  Result Date: 05/24/2016 CLINICAL DATA:  Tibial fracture repair FLUOROSCOPY TIME:  3 minutes and 20 seconds. Images: 10 EXAM: DG C-ARM 61-120 MIN COMPARISON:  None. FINDINGS: A fibular fracture is identified. By the end of the study, an intramedullary rod crosses the tibial fracture with superior and inferior interlocking screws. IMPRESSION: Tibial fracture repair as above.  Fibular fracture. Electronically Signed   By: Gerome Sam III M.D   On: 05/24/2016 17:32   - pertinent xrays, CT, MRI studies were reviewed and independently interpreted  Positive ROS: All other systems have been reviewed and were otherwise negative with the exception of those mentioned in the HPI and as above.  Physical Exam: General: Alert, no acute distress Cardiovascular: No pedal edema Respiratory:  No cyanosis, no use of accessory musculature GI: No organomegaly, abdomen is soft and non-tender Skin: No lesions in the area of chief complaint Neurologic: Sensation intact distally Psychiatric: Patient is competent for consent with normal mood and affect Lymphatic: No axillary or cervical lymphadenopathy  MUSCULOSKELETAL:  - compartments soft - NVI   Assessment: Right tib-fib fx  Plan: - to OR for IMN - admit postop for compartment monitoring   N. Glee Arvin, MD [facility] [phone] 10:15 PM

## 2016-05-26 NOTE — Progress Notes (Addendum)
Occupational Therapy Treatment/Discharge Patient Details Name: Jeremy PlyMatthew A Spencer MRN: [id number] DOB: [birth year] Today's Date: 05/26/2016    History of present illness 26 year old male who presents after mechanical fall. He is otherwise healthy. Taking a break from work tonight when he woke slipped on black ice while walking to his car. R tibia fx, now s/p right tibia closed reduction and intramedullary nailing, NWB, and closed treatment of left fibular shaft fracture with manipulation   OT comments  Pt progressing toward OT goals. Able to complete toilet transfer and grooming at sink with min guard assist. Discussed showering options at length to find safe solution for pt due to his shower set-up at home. Pt able to sit with legs facing out on 3-in-1 to shower and feel this is the safest option due to instability with functional mobility and NWB status of R LE. All education completed concerning ADL safety, fall prevention, and energy conservation with pt and mother and they demonstrate understanding. Pt will have 24 hour assistance from his mother post-acute D/C and she has been present and engaged in all sessions. No further acute OT needs identified. OT will sign off.   Follow Up Recommendations  Home health OT;Supervision/Assistance - 24 hour    Equipment Recommendations  3 in 1 bedside commode (Bariatric)    Recommendations for Other Services      Precautions / Restrictions Precautions Precautions: Fall Required Braces or Orthoses: Other Brace/Splint Other Brace/Splint: Cam boot Restrictions Weight Bearing Restrictions: Yes RLE Weight Bearing: Non weight bearing       Mobility Bed Mobility Overal bed mobility: Needs Assistance Bed Mobility: Sit to Supine       Sit to supine: Supervision      Transfers Overall transfer level: Needs assistance Equipment used: Rolling walker (2 wheeled) Transfers: Sit to/from Stand Sit to Stand: Min guard         General transfer  comment: Min guard for safety.    Balance Overall balance assessment: Needs assistance Sitting-balance support: No upper extremity supported;Feet supported Sitting balance-Leahy Scale: Good     Standing balance support: Bilateral upper extremity supported;Single extremity supported;During functional activity Standing balance-Leahy Scale: Poor Standing balance comment: Reliant on single UE support during grooming tasks at sink.                   ADL Overall ADL's : Needs assistance/impaired Eating/Feeding: Set up;Sitting   Grooming: Min guard;Standing;Wash/dry hands Grooming Details (indicate cue type and reason): Rushing through task and requires single UE support.              Lower Body Dressing: Minimal assistance;Sit to/from stand Lower Body Dressing Details (indicate cue type and reason): Able to doff brace with min assist. Toilet Transfer: Min guard;Ambulation;RW;Regular Social workerToilet   Toileting- Clothing Manipulation and Hygiene: Supervision/safety;Sitting/lateral lean   Tub/ Engineer, structuralhower Transfer: Min guard;Ambulation;3 in 1;Rolling walker Tub/Shower Transfer Details (indicate cue type and reason): Min guard to back up to tub and sit with legs outside of tub. Unable to safely hop over. Shower set-up is such that 3-in-1 will not fit in stall. Functional mobility during ADLs: Min guard;Rolling walker General ADL Comments: Educated pt and mother on safe tub/shower transfers, fall prevention, and energy conservation.      Vision                     Perception     Praxis      Cognition   Behavior During Therapy: [facility] Hospital[facility]FL for tasks assessed/performed;Impulsive Overall  Cognitive Status: Within Functional Limits for tasks assessed                  General Comments: Less impulsive this session.    Extremity/Trunk Assessment               Exercises     Shoulder Instructions       General Comments      Pertinent Vitals/ Pain       Pain  Assessment: Faces Faces Pain Scale: Hurts little more Pain Location: R lower leg when in dependent position Pain Descriptors / Indicators: Aching;Discomfort;Grimacing Pain Intervention(s): Limited activity within patient's tolerance;Monitored during session;Repositioned  Home Living                                          Prior Functioning/Environment              Frequency  Min 2X/week        Progress Toward Goals  OT Goals(current goals can now be found in the care plan section)  Progress towards OT goals: Progressing toward goals  Acute Rehab OT Goals Patient Stated Goal: to be able to manage  OT Goal Formulation: With patient/family Time For Goal Achievement: 06/01/16 Potential to Achieve Goals: Good ADL Goals Pt Will Perform Lower Body Bathing: with modified independence;sit to/from stand (with or without AE) Pt Will Perform Lower Body Dressing: with modified independence;sit to/from stand (with or without AE) Pt Will Transfer to Toilet: with modified independence;ambulating;bedside commode (BSC over toilet) Pt Will Perform Toileting - Clothing Manipulation and hygiene: with modified independence;sit to/from stand Pt Will Perform Tub/Shower Transfer: Shower transfer;with modified independence;shower seat;3 in 1;rolling walker;ambulating Additional ADL Goal #1: Pt will independently identify 3 fall prevention strategies to incorporate into daily routine in order to improve safety with ADL.  Plan Discharge plan remains appropriate    Co-evaluation                 End of Session Equipment Utilized During Treatment: Gait belt;Rolling walker   Activity Tolerance Patient tolerated treatment well   Patient Left with call bell/phone within reach;with family/visitor present;in bed   Nurse Communication Mobility status;Other (comment) (DME needs)        Time: 1610-9604 OT Time Calculation (min): 30 min  Charges: OT General Charges $OT  Visit: 1 Procedure OT Treatments $Self Care/Home Management : 23-37 mins  Doristine Section, OTR/L [phone] 05/26/2016, 12:26 PM

## 2016-05-26 NOTE — Progress Notes (Signed)
Physical Therapy Treatment Patient Details Name: Jeremy Spencer MRN: [id number] DOB: 1991-04-20 Today's Date: 05/26/2016    History of Present Illness 26 year old male who presents after mechanical fall. He is otherwise healthy. Taking a break from work tonight when he woke slipped on black ice while walking to his car. R tibia fx, now s/p right tibia closed reduction and intramedullary nailing, NWB, and closed treatment of left fibular shaft fracture with manipulation    PT Comments    Continuing work on gait training, transfers, and problem-solving through managing the flight of steps to enter home; Noting improving stability and step length with crutches; practiced steps with crutches with one major loss of balance, requiring max assist to prevent fall; recommend "bumping" up the steps to enter his home and to get to his bedroom; OK for dc home from PT standpoint -- I anticipate he will progress well with amb and steps with more practice   Follow Up Recommendations  Home health PT;Supervision - Intermittent     Equipment Recommendations  Rolling walker with 5" wheels;3in1 (PT) (Bariatric)    Recommendations for Other Services       Precautions / Restrictions Precautions Precautions: Fall Required Braces or Orthoses: Other Brace/Splint Other Brace/Splint: Cam boot Restrictions Weight Bearing Restrictions: Yes RLE Weight Bearing: Non weight bearing    Mobility  Bed Mobility Overal bed mobility: Needs Assistance Bed Mobility: Supine to Sit     Supine to sit: Min assist Sit to supine: Supervision   General bed mobility comments: Min assist for RLE  Transfers Overall transfer level: Needs assistance Equipment used: Rolling walker (2 wheeled);Crutches Transfers: Sit to/from Stand Sit to Stand: Min guard         General transfer comment: Min guard for safety. Cues for hand placement with RW and crutch management when using crutches  Ambulation/Gait Ambulation/Gait  assistance: Min guard Ambulation Distance (Feet): 100 Feet Assistive device: Rolling walker (2 wheeled);Crutches Gait Pattern/deviations: Step-to pattern (emerging step-through pattern with crutches)     General Gait Details: Noting improvements in stability and step length with Crutches; using RW well; no loss of balance, but energetically taxing hop-steps   Stairs Stairs: Yes   Stair Management: No rails;With crutches;Forwards Number of Stairs: 2 General stair comments: Discussed multiple ways to manage stairs, with 2 crutches (which we practiced -- one loss of balance requiring max assist to prevent fall), crutches, and sitting down and bumping up steps (and using step stool, ottoman and chair to help get to standing at the top of the stairs)  Wheelchair Mobility    Modified Rankin (Stroke Patients Only)       Balance Overall balance assessment: Needs assistance Sitting-balance support: No upper extremity supported;Feet supported Sitting balance-Leahy Scale: Good     Standing balance support: Bilateral upper extremity supported;Single extremity supported;During functional activity Standing balance-Leahy Scale: Poor Standing balance comment: Reliant on single UE support during grooming tasks at sink.                    Cognition Arousal/Alertness: Awake/alert Behavior During Therapy: WFL for tasks assessed/performed;Impulsive Overall Cognitive Status: Within Functional Limits for tasks assessed                 General Comments: Less impulsive this session.    Exercises      General Comments General comments (skin integrity, edema, etc.): Practiced bumping up from floor to step stool to shower chair to simulate getting up from floor at the top of  the steps; Effortful and slow moving, but able to get up; bumping up steps is his best bet at this point      Pertinent Vitals/Pain Pain Assessment: Faces Faces Pain Scale: Hurts little more Pain Location: R  lower leg when in dependent position Pain Descriptors / Indicators: Aching;Discomfort;Grimacing Pain Intervention(s): Monitored during session    Home Living                      Prior Function            PT Goals (current goals can now be found in the care plan section) Acute Rehab PT Goals Patient Stated Goal: to be able to manage  PT Goal Formulation: With patient Time For Goal Achievement: 06/01/16 Potential to Achieve Goals: Good Progress towards PT goals: Progressing toward goals    Frequency    Min 6X/week      PT Plan Current plan remains appropriate    Co-evaluation             End of Session Equipment Utilized During Treatment: Gait belt Activity Tolerance: Patient tolerated treatment well Patient left: in chair;with call bell/phone within reach;with family/visitor present     Time: [id number] PT Time Calculation (min) (ACUTE ONLY): 55 min  Charges:  $Gait Training: 23-37 mins $Therapeutic Activity: 23-37 mins                    G Codes:      Levi AlandHolly H Tyqwan Pink 05/26/2016, 1:00 PM  Van ClinesHolly Stevey Stapleton, PT  Acute Rehabilitation Services Pager [phone] Office [phone]

## 2016-05-26 NOTE — Discharge Summary (Signed)
Physician Discharge Summary      Patient ID: Jeremy Spencer MRN: [id number] DOB/AGE: 28/08/1990 28 y.o.  Admit date: 05/23/2016 Discharge date: 05/26/2016  Admission Diagnoses:  <principal problem not specified>  Discharge Diagnoses:  Active Problems:   Displaced spiral fracture of shaft of right tibia, initial encounter for closed fracture   History reviewed. No pertinent past medical history.  Surgeries: Procedure(s): INTRAMEDULLARY (IM) NAIL TIBIAL on 05/23/2016 - 05/24/2016   Consultants (if any):   Discharged Condition: Improved  Hospital Course: Jeremy Spencer is an 26 y.o. male who was admitted 05/23/2016 with a diagnosis of <principal problem not specified> and went to the operating room on 05/23/2016 - 05/24/2016 and underwent the above named procedures.    He was given perioperative antibiotics:  Anti-infectives    Start     Dose/Rate Route Frequency Ordered Stop   05/24/16 2000  ceFAZolin (ANCEF) IVPB 2g/100 mL premix     2 g 200 mL/hr over 30 Minutes Intravenous Every 6 hours 05/24/16 1736 05/25/16 1042    .  He was given sequential compression devices, early ambulation, and aspirin for DVT prophylaxis.  He benefited maximally from the hospital stay and there were no complications.    Recent vital signs:  Vitals:   05/25/16 2040 05/26/16 0330  BP: 133/66 131/68  Pulse: (!) 110 99  Resp: 16 16  Temp: 98.3 F (36.8 C) 98.4 F (36.9 C)    Recent laboratory studies:  No results found for: HGB No results found for: WBC, PLT No results found for: INR No results found for: NA, K, CL, CO2, BUN, CREATININE, GLUCOSE  Discharge Medications:   Allergies as of 05/26/2016      Reactions   Amoxicillin Other (See Comments)   Pt unsure of reaction, has a child when they occurred      Medication List    TAKE these medications   aspirin EC 325 MG tablet Take 1 tablet (325 mg total) by mouth 2 (two) times daily.   methocarbamol 750 MG tablet Commonly  known as:  ROBAXIN Take 1 tablet (750 mg total) by mouth 2 (two) times daily as needed for muscle spasms.   ondansetron 4 MG tablet Commonly known as:  ZOFRAN Take 1-2 tablets (4-8 mg total) by mouth every 8 (eight) hours as needed for nausea or vomiting.   oxyCODONE 5 MG immediate release tablet Commonly known as:  Oxy IR/ROXICODONE Take 1-3 tablets (5-15 mg total) by mouth every 4 (four) hours as needed.   oxyCODONE 10 mg 12 hr tablet Commonly known as:  OXYCONTIN Take 1 tablet (10 mg total) by mouth every 12 (twelve) hours.   oxyCODONE-acetaminophen 5-325 MG tablet Commonly known as:  PERCOCET/ROXICET Take 1-2 tablets by mouth every 6 (six) hours as needed for moderate pain or severe pain.   promethazine 25 MG tablet Commonly known as:  PHENERGAN Take 1 tablet (25 mg total) by mouth every 6 (six) hours as needed for nausea.   senna-docusate 8.6-50 MG tablet Commonly known as:  SENOKOT S Take 1 tablet by mouth at bedtime as needed.            Durable Medical Equipment        Start     Ordered   05/24/16 1736  DME Walker rolling  Once    Question:  Patient needs a walker to treat with the following condition  Answer:  History of open reduction and internal fixation (ORIF) procedure   05/24/16 1736  05/24/16 1736  DME 3 n 1  Once     05/24/16 1736   05/24/16 1736  DME Bedside commode  Once    Question:  Patient needs a bedside commode to treat with the following condition  Answer:  History of open reduction and internal fixation (ORIF) procedure   05/24/16 1736      Diagnostic Studies: Dg Tibia/fibula Right  Result Date: 05/24/2016 CLINICAL DATA:  Tibial fracture repair FLUOROSCOPY TIME:  3 minutes and 20 seconds. Images: 10 EXAM: RIGHT TIBIA AND FIBULA - 2 VIEW COMPARISON:  May 23, 2016 FINDINGS: Fibular fractures identified. An intramedullary rod crosses the distal tibial fracture by the end of the study with 1 superior and twos inferior interlocking screws.  Hardware is in good position. IMPRESSION: Tibial fracture repair as above.  Continued fibular fracture. Electronically Signed   By: Gerome Sam III M.D   On: 05/24/2016 17:31   Dg Tibia/fibula Right  Result Date: 05/23/2016 CLINICAL DATA:  Fall on ice. Right leg pain and deformity. Initial encounter. EXAM: RIGHT TIBIA AND FIBULA - 2 VIEW COMPARISON:  None. FINDINGS: Comminuted fractures of the distal tibial and fibular diaphyses are seen, with mild lateral displacement and posterior angulation of the distal fracture fragments. IMPRESSION: Comminuted fractures of distal tibial and fibular diaphyses, as described above. Electronically Signed   By: Myles Rosenthal M.D.   On: 05/23/2016 23:33   Dg Ankle Complete Right  Result Date: 05/23/2016 CLINICAL DATA:  Larey Seat on ice at work today. Right ankle pain and deformity. Initial encounter. EXAM: RIGHT ANKLE - COMPLETE 3+ VIEW COMPARISON:  None. FINDINGS: Comminuted fractures of the distal tibial and fibular diaphyses. There is lateral displacement and mild posterior angulation of the distal fracture fragments. No other fractures are seen involving the ankle joint. No evidence of ankle dislocation. IMPRESSION: Comminuted fractures of distal tibial and fibular diaphyses. Electronically Signed   By: Myles Rosenthal M.D.   On: 05/23/2016 23:34   Dg Tibia/fibula Right Port  Result Date: 05/25/2016 CLINICAL DATA:  ORIF of tibial fracture EXAM: PORTABLE RIGHT TIBIA AND FIBULA - 2 VIEW COMPARISON:  05/24/2016 c-arm fluoroscopic views and radiographs from 05/23/2016 FINDINGS: Intramedullary nail traverses a closed comminuted fracture of the distal tibial diaphysis. There is slight medial displacement 1/4 shaft of the medial tibial fracture fragment. The distal tibial fracture fragment is slightly displaced laterally by 5 mm. Oblique fracture of the distal fibular diaphysis with slight lateral displacement of the distal fracture fragment 1/4 shaft width is seen without  hardware noted. IMPRESSION: ORIF of a comminuted closed distal tibial diaphyseal shaft fracture as above with IM nail. No significant change with respect to a distal fibular diaphyseal fracture. Electronically Signed   By: Tollie Eth M.D.   On: 05/25/2016 03:38   Dg C-arm 1-60 Min  Result Date: 05/24/2016 CLINICAL DATA:  Tibial fracture repair FLUOROSCOPY TIME:  3 minutes and 20 seconds. Images: 10 EXAM: DG C-ARM 61-120 MIN COMPARISON:  None. FINDINGS: A fibular fracture is identified. By the end of the study, an intramedullary rod crosses the tibial fracture with superior and inferior interlocking screws. IMPRESSION: Tibial fracture repair as above.  Fibular fracture. Electronically Signed   By: Gerome Sam III M.D   On: 05/24/2016 17:32    Disposition: Final discharge disposition not confirmed  Discharge Instructions    Call MD / Call 911    Complete by:  As directed    If you experience chest pain or shortness of breath, CALL  911 and be transported to the hospital emergency room.  If you develope a fever above 101.5 F, pus (white drainage) or increased drainage or redness at the wound, or calf pain, call your surgeon's office.   Constipation Prevention    Complete by:  As directed    Drink plenty of fluids.  Prune juice may be helpful.  You may use a stool softener, such as Colace (over the counter) 100 mg twice a day.  Use MiraLax (over the counter) for constipation as needed.   Driving restrictions    Complete by:  As directed    No driving while taking narcotic pain meds.   Increase activity slowly as tolerated    Complete by:  As directed       Follow-up Information    [facility]-EMERGENCY DEPT Follow up.   Specialty:  Emergency Medicine Why:  If symptoms worsen Contact information: [address] [id number] [address]c [place] [address] [phone]       Call Glee Arvin, MD.   Specialty:  Orthopedic Surgery Why:  Orthopedic surgery  follow-up Contact information: [address] [place] [address] [phone]        Glee Arvin, MD Follow up in 2 week(s).   Specialty:  Orthopedic Surgery Why:  For suture removal, For wound re-check Contact information: [address] [place] [address] [phone]            Signed: Glee Arvin 05/26/2016, 8:02 AM

## 2016-05-26 NOTE — Progress Notes (Signed)
   Subjective:  Patient reports pain as moderate.  No events.  Objective:   VITALS:   Vitals:   05/25/16 0028 05/25/16 0608 05/25/16 2040 05/26/16 0330  BP: 134/67 119/69 133/66 131/68  Pulse: (!) 102 92 (!) 110 99  Resp: 16 16 16 16   Temp: 99.7 F (37.6 C) 98.4 F (36.9 C) 98.3 F (36.8 C) 98.4 F (36.9 C)  TempSrc: Oral Oral Oral Oral  SpO2: 97% 97% 100% 98%  Weight:      Height:        Neurologically intact Neurovascular intact Sensation intact distally Intact pulses distally Dorsiflexion/Plantar flexion intact Incision: dressing C/D/I and no drainage No cellulitis present Compartment soft   No results found for: WBC, HGB, HCT, MCV, PLT   Assessment/Plan:  2 Days Post-Op   - Expected postop acute blood loss anemia - will monitor for symptoms - Up with PT/OT - DVT ppx - SCDs, ambulation, aspirin - NWB operative extremity - Pain control - Discharge planning - home today  Jeremy ArvinMichael Dulcinea Spencer 05/26/2016, 8:01 AM [phone]

## 2016-06-01 ENCOUNTER — Telehealth (INDEPENDENT_AMBULATORY_CARE_PROVIDER_SITE_OTHER): Payer: Self-pay | Admitting: *Deleted

## 2016-06-01 NOTE — Telephone Encounter (Signed)
Out for 3 months

## 2016-06-01 NOTE — Telephone Encounter (Signed)
Please advise 

## 2016-06-01 NOTE — Telephone Encounter (Signed)
Patient called in this morning in regards to needing a work note for how long he will be out for. His CB # [phone]. Thank you

## 2016-06-02 ENCOUNTER — Telehealth (INDEPENDENT_AMBULATORY_CARE_PROVIDER_SITE_OTHER): Payer: Self-pay | Admitting: *Deleted

## 2016-06-02 NOTE — Telephone Encounter (Signed)
Faxed to [phone]

## 2016-06-02 NOTE — Telephone Encounter (Signed)
Faxed to [phone]  

## 2016-06-02 NOTE — Telephone Encounter (Signed)
Pt called back about this note. FAX: [phone] [facility] [place] [facility]

## 2016-06-09 ENCOUNTER — Ambulatory Visit (INDEPENDENT_AMBULATORY_CARE_PROVIDER_SITE_OTHER): Payer: Self-pay

## 2016-06-09 ENCOUNTER — Ambulatory Visit (INDEPENDENT_AMBULATORY_CARE_PROVIDER_SITE_OTHER): Payer: Worker's Compensation | Admitting: Orthopaedic Surgery

## 2016-06-09 DIAGNOSIS — S82241A Displaced spiral fracture of shaft of right tibia, initial encounter for closed fracture: Secondary | ICD-10-CM | POA: Diagnosis not present

## 2016-06-09 NOTE — Progress Notes (Signed)
2 weeks postop visit for right tibia IM nail.  Pain well controlled. Incisions all healed.  Calf soft.  Expected postop swelling.  No knee effusion.  xrays stable.  Staples removed.  Continue NWB.  CAM boot when OOB.  Anticipated OOW at least 3-6 months.  F/u 4 weeks repeat xrays of right tib-fib.

## 2016-06-15 ENCOUNTER — Telehealth (INDEPENDENT_AMBULATORY_CARE_PROVIDER_SITE_OTHER): Payer: Self-pay

## 2016-06-15 NOTE — Telephone Encounter (Signed)
Faxed the 06/09/16 office note to case mgr Neita CarpSteve Johnson per his request. Fax # [phone].

## 2016-06-16 ENCOUNTER — Other Ambulatory Visit (INDEPENDENT_AMBULATORY_CARE_PROVIDER_SITE_OTHER): Payer: Self-pay | Admitting: Orthopaedic Surgery

## 2016-06-18 NOTE — Telephone Encounter (Signed)
Please advise 

## 2016-07-01 ENCOUNTER — Telehealth (INDEPENDENT_AMBULATORY_CARE_PROVIDER_SITE_OTHER): Payer: Self-pay

## 2016-07-01 NOTE — Telephone Encounter (Signed)
Received voicemail needing to know when pts next appt is. I call him back and had to leave voicemail advising that he is scheduled for tomorrow @ 9:15 w/Dr. Roda ShuttersXu

## 2016-07-02 ENCOUNTER — Ambulatory Visit (INDEPENDENT_AMBULATORY_CARE_PROVIDER_SITE_OTHER): Payer: Worker's Compensation

## 2016-07-02 ENCOUNTER — Encounter (INDEPENDENT_AMBULATORY_CARE_PROVIDER_SITE_OTHER): Payer: Self-pay | Admitting: Orthopaedic Surgery

## 2016-07-02 ENCOUNTER — Ambulatory Visit (INDEPENDENT_AMBULATORY_CARE_PROVIDER_SITE_OTHER): Payer: Worker's Compensation | Admitting: Orthopaedic Surgery

## 2016-07-02 DIAGNOSIS — S82241A Displaced spiral fracture of shaft of right tibia, initial encounter for closed fracture: Secondary | ICD-10-CM | POA: Diagnosis not present

## 2016-07-02 NOTE — Progress Notes (Signed)
Jeremy Spencer is 6 weeks status post IM nailing of a tibia fracture. He is doing well overall and complains of some mild swelling in his lower extremity. He denies any constitutional symptoms. The physical exam is benign. He does have 1+ pitting edema. The foot is warm well-perfused. X-ray show stable fixation with early bridging callus formation. We will allow him to weight-bear as tolerated in a cam boot with physical therapy at this point in 2 advance with strengthening. I will like to see him back in 6 weeks with repeat 2 view x-rays of the right tib-fib. And continue out of work for 2 more months at least.

## 2016-07-03 ENCOUNTER — Telehealth (INDEPENDENT_AMBULATORY_CARE_PROVIDER_SITE_OTHER): Payer: Self-pay | Admitting: Orthopaedic Surgery

## 2016-07-03 NOTE — Telephone Encounter (Signed)
Jeremy Spencer with [facility] called needing patient's Rx for (PT) faxed over to her. The fax# is [phone]  Ref# [id number]

## 2016-07-06 ENCOUNTER — Ambulatory Visit (INDEPENDENT_AMBULATORY_CARE_PROVIDER_SITE_OTHER): Payer: Self-pay | Admitting: Orthopaedic Surgery

## 2016-07-08 NOTE — Telephone Encounter (Signed)
faxed

## 2016-07-14 ENCOUNTER — Telehealth (INDEPENDENT_AMBULATORY_CARE_PROVIDER_SITE_OTHER): Payer: Self-pay

## 2016-07-14 NOTE — Telephone Encounter (Signed)
Received email from case mgr requesting the 07/02/16 office and work note. Faxed to him @ [phone].

## 2016-07-15 ENCOUNTER — Telehealth (INDEPENDENT_AMBULATORY_CARE_PROVIDER_SITE_OTHER): Payer: Self-pay | Admitting: *Deleted

## 2016-07-15 NOTE — Telephone Encounter (Signed)
Please advise 

## 2016-07-15 NOTE — Telephone Encounter (Signed)
Knee high

## 2016-07-15 NOTE — Telephone Encounter (Signed)
Calling asking if TED hose are knee high or thigh high

## 2016-07-15 NOTE — Telephone Encounter (Signed)
Calling asking if TED hose are knee high or thigh high?  [phone] ex: [phone]

## 2016-07-16 NOTE — Telephone Encounter (Signed)
Called to advise.  

## 2016-07-29 ENCOUNTER — Telehealth (INDEPENDENT_AMBULATORY_CARE_PROVIDER_SITE_OTHER): Payer: Self-pay | Admitting: *Deleted

## 2016-07-29 NOTE — Telephone Encounter (Signed)
Jacqualine CodeBrian Petrarca called this afternoon in regards to needing a referral for this patient to have PT. The Fax # [phone]. Thank you

## 2016-07-29 NOTE — Telephone Encounter (Signed)
Order faxed to number provided below

## 2016-08-05 ENCOUNTER — Telehealth (INDEPENDENT_AMBULATORY_CARE_PROVIDER_SITE_OTHER): Payer: Self-pay | Admitting: *Deleted

## 2016-08-05 NOTE — Telephone Encounter (Signed)
Cathlean Cower calling about H wave device for pt CB: [phone]

## 2016-08-06 NOTE — Telephone Encounter (Signed)
Called [place] back. Margaret handled this and has the paperwork ready for pick up at the front desk for her. Thank you.

## 2016-08-13 ENCOUNTER — Ambulatory Visit (INDEPENDENT_AMBULATORY_CARE_PROVIDER_SITE_OTHER): Payer: Worker's Compensation

## 2016-08-13 ENCOUNTER — Ambulatory Visit (INDEPENDENT_AMBULATORY_CARE_PROVIDER_SITE_OTHER): Payer: Worker's Compensation | Admitting: Orthopaedic Surgery

## 2016-08-13 DIAGNOSIS — S82241D Displaced spiral fracture of shaft of right tibia, subsequent encounter for closed fracture with routine healing: Secondary | ICD-10-CM

## 2016-08-13 NOTE — Progress Notes (Signed)
The patient is 11-1/2 weeks status post intramedullary fixation of his right tibia fracture. He is improving overall. He is getting better but still has pain and has difficulty ambulating. He still has significant swelling and gait abnormality. He is ambulating with one crutch currently. He is only able to walk short distances. On exam his right lower extremity does have moderate swelling. There is no signs of infection or cellulitis. His ankle range of motion is appropriate. His x-rays demonstrate that he is healing the fracture with increasing callus formation. At this point I would like him to wean the cam boot and the crutches with physical therapy. I do think that he needs more physical therapy given his gait abnormalities.  He would benefit from 6 more weeks of physical therapy at twice a week. I anticipate that he'll be able to return to his job in about 6 weeks on a reduced schedule since his work is mainly sedentary. He is to continue to ice and elevate for the swelling. I don't feel strongly about the H wave.

## 2016-08-17 ENCOUNTER — Telehealth (INDEPENDENT_AMBULATORY_CARE_PROVIDER_SITE_OTHER): Payer: Self-pay | Admitting: *Deleted

## 2016-08-17 NOTE — Telephone Encounter (Signed)
Stacy stevens from [facility] calling asking why H wave was put on hold CB: [phone]

## 2016-08-18 NOTE — Telephone Encounter (Signed)
Do you anything about this?

## 2016-08-18 NOTE — Telephone Encounter (Signed)
Yes.  I don't think it's medically necessary

## 2016-08-19 NOTE — Telephone Encounter (Signed)
Called stacy to let her know per Dr Roda Shutters

## 2016-08-21 ENCOUNTER — Telehealth (INDEPENDENT_AMBULATORY_CARE_PROVIDER_SITE_OTHER): Payer: Self-pay

## 2016-08-21 NOTE — Telephone Encounter (Signed)
Faxed the 08/13/16 office and work note to case mgr per his request because he states he never received my prior email

## 2016-09-24 ENCOUNTER — Encounter (INDEPENDENT_AMBULATORY_CARE_PROVIDER_SITE_OTHER): Payer: Self-pay | Admitting: Orthopaedic Surgery

## 2016-09-24 ENCOUNTER — Encounter (INDEPENDENT_AMBULATORY_CARE_PROVIDER_SITE_OTHER): Payer: Self-pay

## 2016-09-24 ENCOUNTER — Ambulatory Visit (INDEPENDENT_AMBULATORY_CARE_PROVIDER_SITE_OTHER): Payer: Worker's Compensation | Admitting: Orthopaedic Surgery

## 2016-09-24 ENCOUNTER — Ambulatory Visit (INDEPENDENT_AMBULATORY_CARE_PROVIDER_SITE_OTHER): Payer: Self-pay

## 2016-09-24 DIAGNOSIS — S82241D Displaced spiral fracture of shaft of right tibia, subsequent encounter for closed fracture with routine healing: Secondary | ICD-10-CM | POA: Diagnosis not present

## 2016-09-24 NOTE — Progress Notes (Addendum)
   Office Visit Note   Patient: Jeremy Spencer           Date of Birth: December 02, 1990           MRN: [id number] Visit Date: 09/24/2016              Requested by: No referring provider defined for this encounter. PCP: Patient, No Pcp Per   Assessment & Plan: Visit Diagnoses:  1. Closed displaced spiral fracture of shaft of right tibia with routine healing     Plan: X-rays show improved appearance of bridging callus formation. He has a nonunion of the fibula fracture. Overall fixation is stable. At this point I feel that he is ready to return to work on a reduced schedule with 20 hours per week for 4 weeks then 30 hours per week for another 4 weeks and anticipate releasing him back to full time after that. I would like to check back with him again in about 2 months to reassess that. I do want him to only do daytime work because these not completely steady on his feet and I want him to be able to feel confident in traveling to and from work and to improve his anxiety about falling. The patient is agreement with the plan. I'll see him back in about 2 months with repeat 2 view x-rays of the right tibia fib. Total face to face encounter time was greater than 25 minutes and over half of this time was spent in counseling and/or coordination of care.  Follow-Up Instructions: Return in about 2 months (around 11/24/2016).   Orders:  Orders Placed This Encounter  Procedures  . XR Tibia/Fibula Right   No orders of the defined types were placed in this encounter.     Procedures: No procedures performed   Clinical Data: No additional findings.   Subjective: Chief Complaint  Patient presents with  . Right Leg - Follow-up    Matthews 4 months status post intramedullary fixation of right tibia fracture. He is advancing and progressing well with physical therapy. He is ready to go back to work on a reduced schedule. He still does exhibit significant anxiety about returning to work since the  accident happened at work. He denies any significant pain or swelling or numbness.    Review of Systems   Objective: Vital Signs: There were no vitals taken for this visit.  Physical Exam  Ortho Exam Right lower extremity exam shows well-healed fully healed surgical scars. Minimal swelling. No pain with palpation. He does walk with a slight limp due to expected weakness. Specialty Comments:  No specialty comments available.  Imaging: No results found.   PMFS History: Patient Active Problem List   Diagnosis Date Noted  . Closed displaced spiral fracture of shaft of right tibia with routine healing 05/24/2016   No past medical history on file.  No family history on file.  Past Surgical History:  Procedure Laterality Date  . TIBIA IM NAIL INSERTION Right 05/24/2016   Procedure: INTRAMEDULLARY (IM) NAIL TIBIAL;  Surgeon: Tarry KosNaiping M Xu, MD;  Location: [facility] OR;  Service: Orthopedics;  Laterality: Right;   Social History   Occupational History  . Not on file.   Social History Main Topics  . Smoking status: Never Smoker  . Smokeless tobacco: Never Used  . Alcohol use No  . Drug use: No  . Sexual activity: Yes    Birth control/ protection: Condom

## 2016-10-27 ENCOUNTER — Telehealth (INDEPENDENT_AMBULATORY_CARE_PROVIDER_SITE_OTHER): Payer: Self-pay | Admitting: Orthopaedic Surgery

## 2016-10-27 NOTE — Telephone Encounter (Signed)
Records 05/23/2016 to present faxed [employer] [facility]

## 2016-11-24 ENCOUNTER — Ambulatory Visit (INDEPENDENT_AMBULATORY_CARE_PROVIDER_SITE_OTHER): Payer: Worker's Compensation | Admitting: Orthopaedic Surgery

## 2016-11-24 ENCOUNTER — Ambulatory Visit (INDEPENDENT_AMBULATORY_CARE_PROVIDER_SITE_OTHER): Payer: Worker's Compensation

## 2016-11-24 DIAGNOSIS — S82241D Displaced spiral fracture of shaft of right tibia, subsequent encounter for closed fracture with routine healing: Secondary | ICD-10-CM

## 2016-11-24 NOTE — Progress Notes (Signed)
Office Visit Note   Patient: Jeremy Spencer           Date of Birth: [birth year]           MRN: [id number] Visit Date: 11/24/2016              Requested by: No referring provider defined for this encounter. PCP: Patient, No Pcp Per   Assessment & Plan: Visit Diagnoses:  1. Closed displaced spiral fracture of shaft of right tibia with routine healing     Plan: Patient has essentially reached MMI. At this point he may return back to full duty.  I will like him to be allowed to take breaks and occasional days off for periodic flareups that are expected but just for the next 3 months after which he may be released to full duty without any restriction. According to the [employer] commission rating guide I gave him a permanent partial impairment of 20%.  Follow-up as needed. All were in agreement with the plan.  Total face to face encounter time was greater than 25 minutes and over half of this time was spent in counseling and/or coordination of care.  Follow-Up Instructions: Return if symptoms worsen or fail to improve.   Orders:  Orders Placed This Encounter  Procedures  . XR Tibia/Fibula Right   No orders of the defined types were placed in this encounter.     Procedures: No procedures performed   Clinical Data: No additional findings.   Subjective: No chief complaint on file.   Patient is 6 months status post intramedullary fixation of a tibial shaft fracture. He is overall improving and doing better. He still lacks endurance with walking and climbing stairs. He does have some residual limping.    Review of Systems  Constitutional: Negative.   All other systems reviewed and are negative.    Objective: Vital Signs: There were no vitals taken for this visit.  Physical Exam  Constitutional: He is oriented to person, place, and time. He appears well-developed and well-nourished.  HENT:  Head: Normocephalic and atraumatic.  Eyes: Pupils are  equal, round, and reactive to light.  Neck: Neck supple.  Pulmonary/Chest: Effort normal.  Abdominal: Soft.  Musculoskeletal: Normal range of motion.  Neurological: He is alert and oriented to person, place, and time.  Skin: Skin is warm.  Psychiatric: He has a normal mood and affect. His behavior is normal. Judgment and thought content normal.  Nursing note and vitals reviewed.   Ortho Exam Right leg exam shows no tenderness over the fibular nonunion. He does have some mild swelling. Otherwise exam is relatively benign. Specialty Comments:  No specialty comments available.  Imaging: Xr Tibia/fibula Right  Result Date: 11/24/2016 Abundant callus formation of the tibia fracture consistent with healing. Nonunion of fibula fracture ankle mortise is stable.    PMFS History: Patient Active Problem List   Diagnosis Date Noted  . Closed displaced spiral fracture of shaft of right tibia with routine healing 05/24/2016   No past medical history on file.  No family history on file.  Past Surgical History:  Procedure Laterality Date  . TIBIA IM NAIL INSERTION Right 05/24/2016   Procedure: INTRAMEDULLARY (IM) NAIL TIBIAL;  Surgeon: Tarry KosNaiping M Braidon Chermak, MD;  Location: [facility] OR;  Service: Orthopedics;  Laterality: Right;   Social History   Occupational History  . Not on file.   Social History Main Topics  . Smoking status: Never Smoker  . Smokeless tobacco: Never Used  .  Alcohol use No  . Drug use: No  . Sexual activity: Yes    Birth control/ protection: Condom

## 2016-11-27 ENCOUNTER — Telehealth (INDEPENDENT_AMBULATORY_CARE_PROVIDER_SITE_OTHER): Payer: Self-pay

## 2016-11-27 NOTE — Telephone Encounter (Signed)
Faxed the 11/24/16 work and office note to case mgr per his request

## 2017-01-05 ENCOUNTER — Encounter (INDEPENDENT_AMBULATORY_CARE_PROVIDER_SITE_OTHER): Payer: Self-pay | Admitting: Orthopaedic Surgery

## 2017-02-10 ENCOUNTER — Telehealth (INDEPENDENT_AMBULATORY_CARE_PROVIDER_SITE_OTHER): Payer: Self-pay

## 2017-02-10 NOTE — Telephone Encounter (Signed)
Can you make this for me? Thanks

## 2017-02-10 NOTE — Telephone Encounter (Signed)
I received an email from this patients [facility] case mgr requesting a CD of all of the pts xrays because pt is going for a 2nd opinion. Can you please help me with getting this?

## 2017-05-27 NOTE — Telephone Encounter (Signed)
Cd was made and pt called and notified ready for pick up back in October

## 2017-08-15 IMAGING — CR DG TIBIA/FIBULA PORT 2V*R*
4 series · 4 of 4 positions shown · non-contrast
Comparison: 05/24/2016 c-arm fluoroscopic views and radiographs
from 05/23/2016

CLINICAL DATA: ORIF of tibial fracture

EXAM:
PORTABLE RIGHT TIBIA AND FIBULA - 2 VIEW

[AP (1 of 2)]
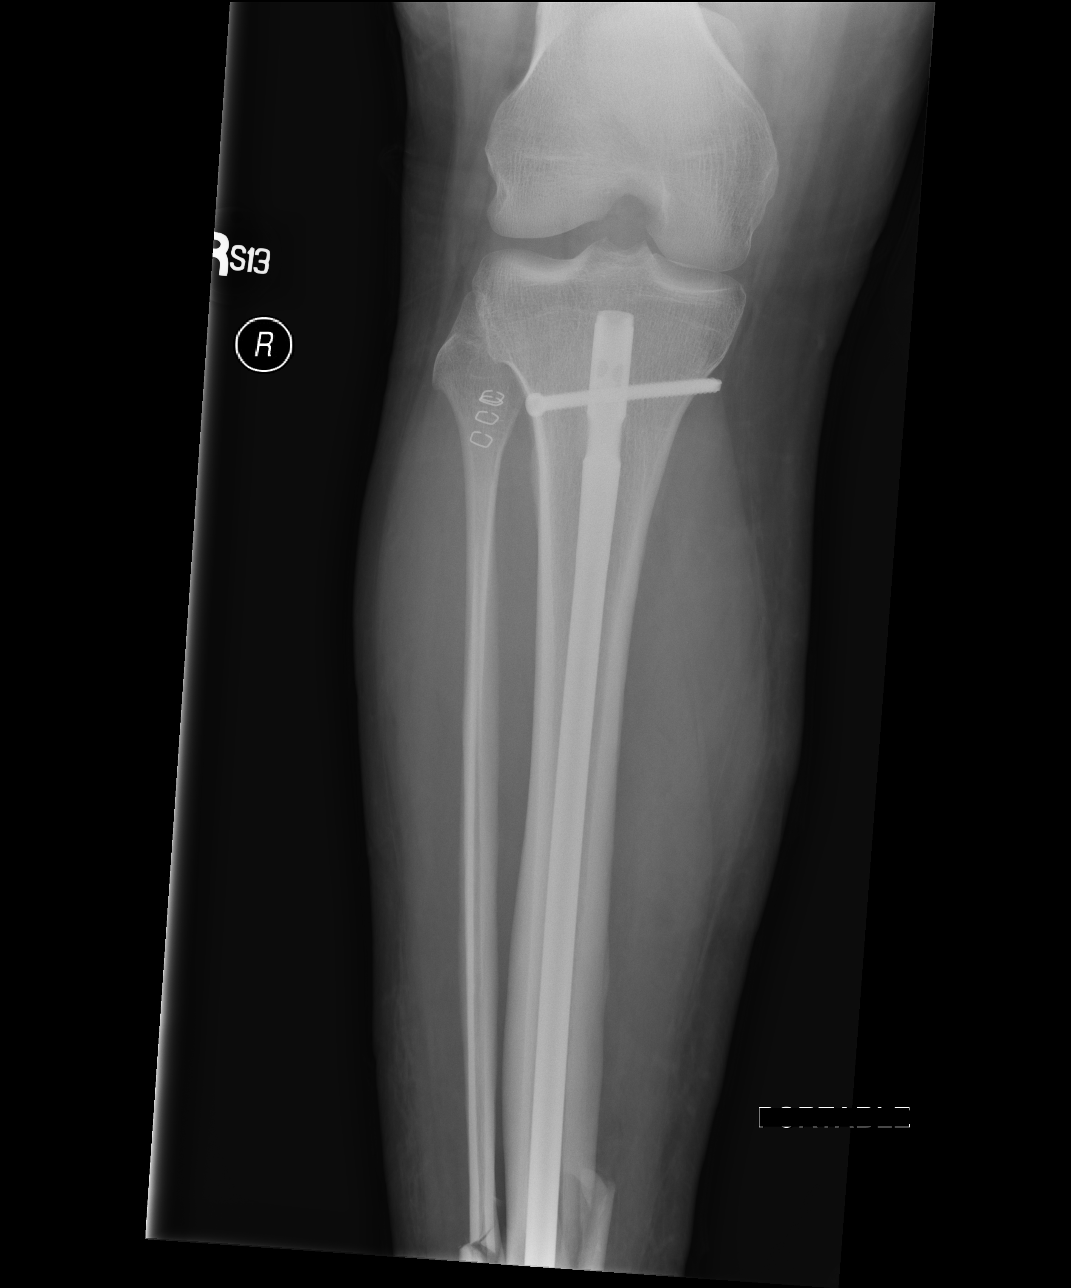

[lateral (1 of 2)]
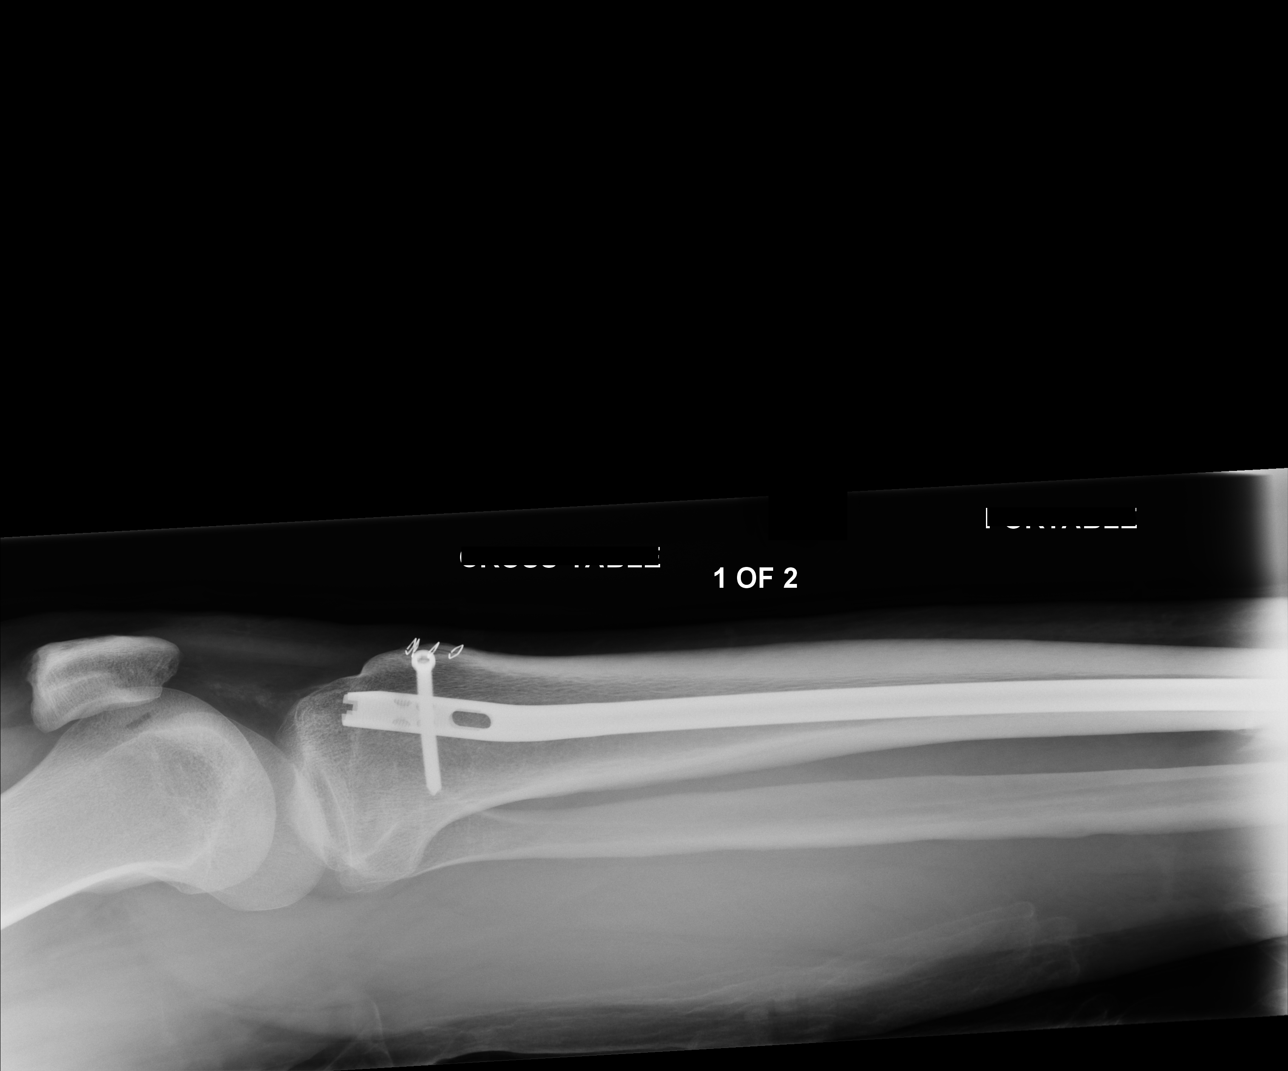

[lateral (2 of 2)]
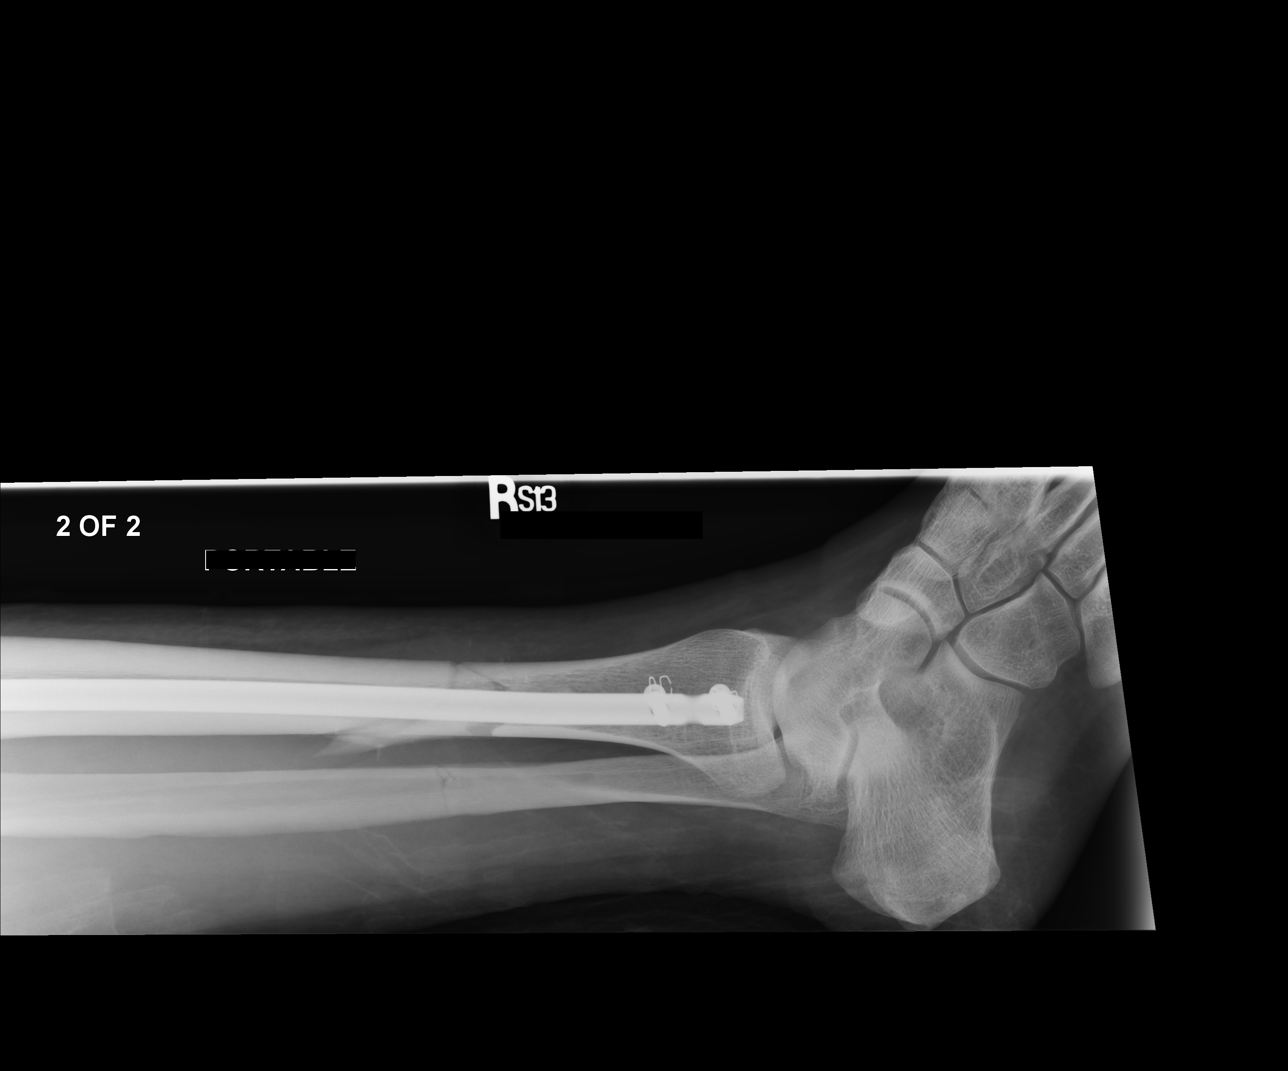

[AP (2 of 2)]
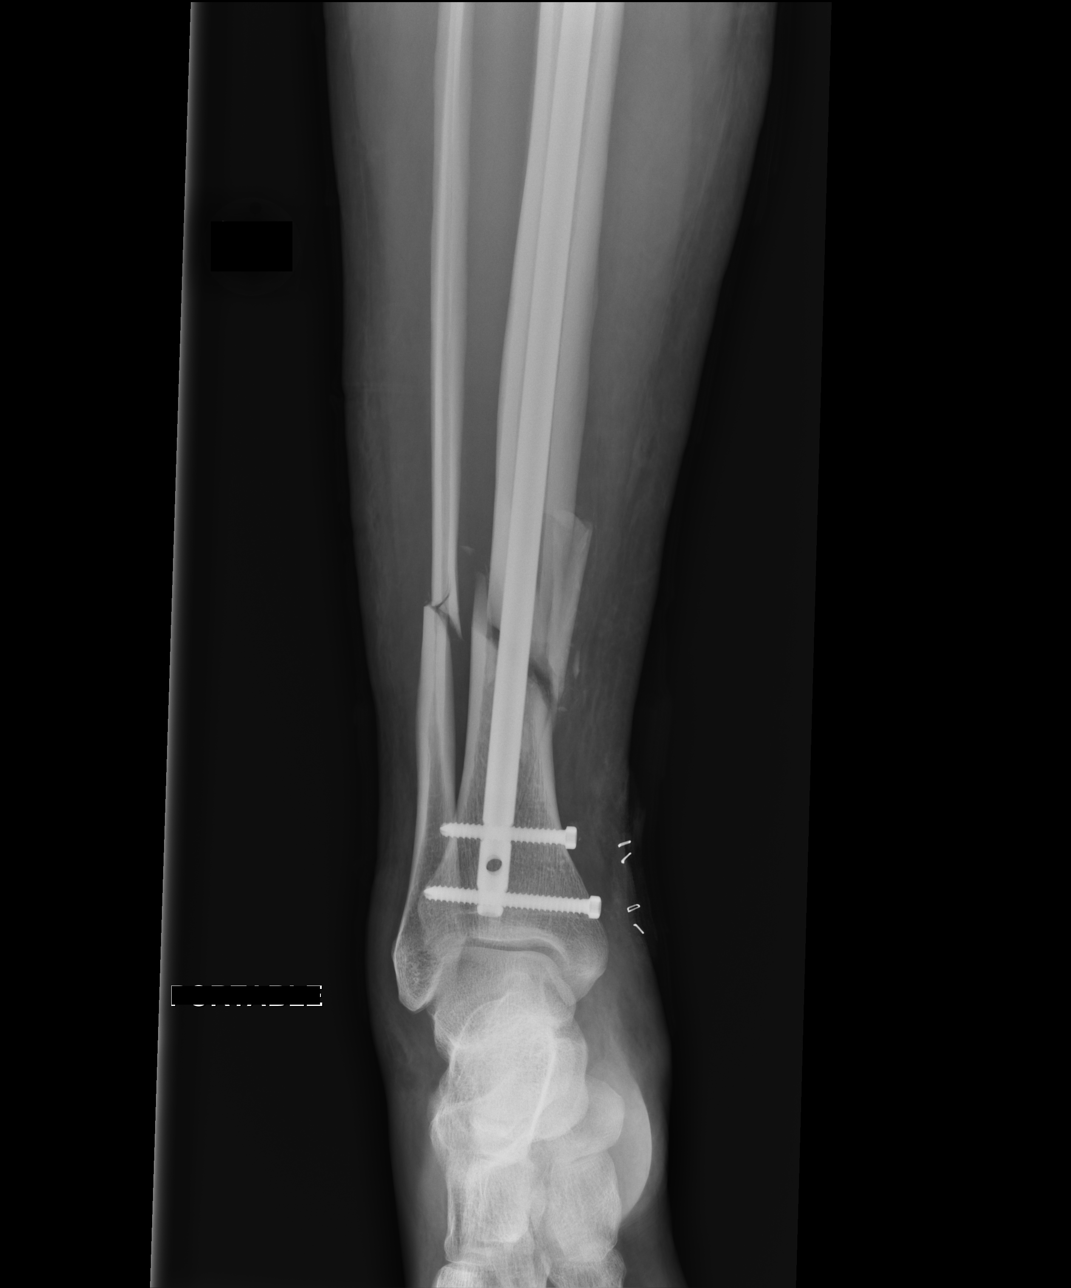

[4 of 4 positions shown; findings below may reference images not displayed]

FINDINGS: Intramedullary nail traverses a closed comminuted fracture of the
distal tibial diaphysis. There is slight medial displacement [DATE]
shaft of the medial tibial fracture fragment. The distal tibial
fracture fragment is slightly displaced laterally by 5 mm. Oblique
fracture of the distal fibular diaphysis with slight lateral
displacement of the distal fracture fragment [DATE] shaft width is seen
without hardware noted.
IMPRESSION: ORIF of a comminuted closed distal tibial diaphyseal shaft fracture
as above with IM nail.

No significant change with respect to a distal fibular diaphyseal
fracture.

## 2017-11-02 ENCOUNTER — Telehealth (INDEPENDENT_AMBULATORY_CARE_PROVIDER_SITE_OTHER): Payer: Self-pay | Admitting: Orthopaedic Surgery

## 2017-11-02 NOTE — Telephone Encounter (Signed)
See message below °

## 2017-11-02 NOTE — Telephone Encounter (Signed)
Patient would like a medical card/ note that he can carry with him due to him getting a job where he travels a lot, just to explain that he has a rod in his leg? Patients # [phone]

## 2017-11-02 NOTE — Telephone Encounter (Signed)
Sure we can give him a Rx that says that.

## 2017-11-03 ENCOUNTER — Encounter (INDEPENDENT_AMBULATORY_CARE_PROVIDER_SITE_OTHER): Payer: Self-pay

## 2017-11-03 NOTE — Telephone Encounter (Signed)
Ready for pick up. Called pt he is aware.

## 2020-02-05 ENCOUNTER — Encounter (INDEPENDENT_AMBULATORY_CARE_PROVIDER_SITE_OTHER): Payer: Self-pay | Admitting: Otolaryngology

## 2020-02-05 ENCOUNTER — Other Ambulatory Visit: Payer: Self-pay

## 2020-02-05 ENCOUNTER — Ambulatory Visit (INDEPENDENT_AMBULATORY_CARE_PROVIDER_SITE_OTHER): Payer: No Typology Code available for payment source | Admitting: Otolaryngology

## 2020-02-05 VITALS — Temp 97.2°F

## 2020-02-05 DIAGNOSIS — H6983 Other specified disorders of Eustachian tube, bilateral: Secondary | ICD-10-CM | POA: Diagnosis not present

## 2020-02-05 DIAGNOSIS — H6123 Impacted cerumen, bilateral: Secondary | ICD-10-CM

## 2020-02-05 NOTE — Progress Notes (Signed)
HPI: Jeremy Spencer is a 29 y.o. male who presents for evaluation of fullness and popping in his right ear.  This apparently began after he had a sinus infection and went flying.  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  Recently he had trouble equalizing pressure in his ears with the right ear being clogged.  He has been treated with nasal steroid spray and is doing a little bit better but still has a popping sound in the ear and he does not feel like he can pop them open for the past month.  Apparently this began following a flight on August 18th. He has had no drainage from his ears.  He does use Q-tips.  No past medical history on file. Past Surgical History:  Procedure Laterality Date  . TIBIA IM NAIL INSERTION Right 05/24/2016   Procedure: INTRAMEDULLARY (IM) NAIL TIBIAL;  Surgeon: Tarry Kos, MD;  Location: [facility] OR;  Service: Orthopedics;  Laterality: Right;   Social History   Socioeconomic History  . Marital status: Single    Spouse name: Not on file  . Number of children: Not on file  . Years of education: Not on file  . Highest education level: Not on file  Occupational History  . Not on file  Tobacco Use  . Smoking status: Never Smoker  . Smokeless tobacco: Never Used  Substance and Sexual Activity  . Alcohol use: No  . Drug use: No  . Sexual activity: Yes    Birth control/protection: Condom  Other Topics Concern  . Not on file  Social History Narrative  . Not on file   Social Determinants of Health   Financial Resource Strain:   . Difficulty of Paying Living Expenses: Not on file  Food Insecurity:   . Worried About Programme researcher, broadcasting/film/video in the Last Year: Not on file  . Ran Out of Food in the Last Year: Not on file  Transportation Needs:   . Lack of Transportation (Medical): Not on file  . Lack of Transportation (Non-Medical): Not on file  Physical Activity:   . Days of Exercise per Week: Not on file  . Minutes of Exercise per  Session: Not on file  Stress:   . Feeling of Stress : Not on file  Social Connections:   . Frequency of Communication with Friends and Family: Not on file  . Frequency of Social Gatherings with Friends and Family: Not on file  . Attends Religious Services: Not on file  . Active Member of Clubs or Organizations: Not on file  . Attends Banker Meetings: Not on file  . Marital Status: Not on file   No family history on file. Allergies  Allergen Reactions  . Amoxicillin Other (See Comments)    Pt unsure of reaction, has a child when they occurred   Prior to Admission medications   Medication Sig Start Date End Date Taking? Authorizing Provider  aspirin EC 325 MG tablet Take 1 tablet (325 mg total) by mouth 2 (two) times daily. 05/24/16  Yes Tarry Kos, MD  methocarbamol (ROBAXIN) 750 MG tablet TAKE 1 TABLET BY MOUTH TWICE A DAY AS NEEDED FOR MUSCLE SPASMS 06/18/16  Yes Tarry Kos, MD  ondansetron (ZOFRAN) 4 MG tablet Take 1-2 tablets (4-8 mg total) by mouth every 8 (eight) hours as needed for nausea or vomiting. 05/24/16  Yes Tarry Kos, MD  oxyCODONE (OXY IR/ROXICODONE) 5 MG immediate release tablet Take  1-3 tablets (5-15 mg total) by mouth every 4 (four) hours as needed. 05/24/16  Yes Tarry Kos, MD  oxyCODONE (OXYCONTIN) 10 mg 12 hr tablet Take 1 tablet (10 mg total) by mouth every 12 (twelve) hours. 05/24/16  Yes Tarry Kos, MD  oxyCODONE-acetaminophen (PERCOCET/ROXICET) 5-325 MG tablet Take 1-2 tablets by mouth every 6 (six) hours as needed for moderate pain or severe pain. 05/24/16  Yes Lavera Guise, MD  promethazine (PHENERGAN) 25 MG tablet Take 1 tablet (25 mg total) by mouth every 6 (six) hours as needed for nausea. 05/24/16  Yes Tarry Kos, MD  senna-docusate (SENOKOT S) 8.6-50 MG tablet Take 1 tablet by mouth at bedtime as needed. 05/24/16  Yes Tarry Kos, MD     Positive ROS: Otherwise negative  All other systems have been reviewed and were otherwise  negative with the exception of those mentioned in the HPI and as above.  Physical Exam: Constitutional: Alert, well-appearing, no acute distress Ears: External ears without lesions or tenderness.  He has a small amount of wax in both ear canals that is been pushed close to the eardrums.  He also has some hairs in the left ear canal that were removed with forceps.  The ear canals were otherwise cleaned with suction and hydrogen peroxide.  The TMs were clear bilaterally with good mobility on pneumatic otoscopy.  No obvious middle ear effusion noted.  On hearing screening with the 512 1024 tuning fork he heard well in both ears with AC > BC bilaterally and Weber midline. Nasal: External nose without lesions. Septum relatively midline with mild rhinitis.  Both middle meatus regions are clear with no signs of infection.. Oral: Lips and gums without lesions. Tongue and palate mucosa without lesions. Posterior oropharynx clear. Neck: No palpable adenopathy or masses Respiratory: Breathing comfortably  Skin: No facial/neck lesions or rash noted.  Cerumen impaction removal  Date/Time: 02/05/2020 5:04 PM Performed by: Drema Halon, MD Authorized by: Drema Halon, MD   Consent:    Consent obtained:  Verbal   Consent given by:  Patient   Risks discussed:  Pain and bleeding Procedure details:    Location:  L ear and R ear   Procedure type: suction and forceps   Post-procedure details:    Inspection:  TM intact and canal normal   Hearing quality:  Improved   Patient tolerance of procedure:  Tolerated well, no immediate complications Comments:     TMs are clear bilaterally.    Assessment: Symptoms consistent with eustachian tube dysfunction. Mild wax buildup.  Plan: Cautioned him about not using Q-tips.  Would recommend use of Debrox or hydrogen peroxide to clean the ears. Reviewed with him concerning eustachian tube dysfunction and to use nasal steroid spray Flonase or  Nasacort 2 sprays at night until the popping stops.  He can also take Sudafed prior to the flight see if he has trouble equalizing pressure but this has not been a chronic problem until he went flying with the upper respiratory infection. Discussed with him that there is no obvious middle ear effusion noted on microscopic exam in the office today.  Narda Bonds, MD
